# Patient Record
Sex: Female | Born: 1940 | Race: White | Hispanic: No | Marital: Married | Smoking: Never smoker
Health system: Southern US, Community
[De-identification: ages and names within clinical notes are randomized; demographics above are authoritative.]

## PROBLEM LIST (undated history)

## (undated) DIAGNOSIS — E785 Hyperlipidemia, unspecified: Secondary | ICD-10-CM

## (undated) DIAGNOSIS — M8580 Other specified disorders of bone density and structure, unspecified site: Secondary | ICD-10-CM

## (undated) DIAGNOSIS — K759 Inflammatory liver disease, unspecified: Secondary | ICD-10-CM

## (undated) DIAGNOSIS — F0390 Unspecified dementia without behavioral disturbance: Secondary | ICD-10-CM

## (undated) HISTORY — DX: Other specified disorders of bone density and structure, unspecified site: M85.80

## (undated) HISTORY — DX: Hyperlipidemia, unspecified: E78.5

## (undated) HISTORY — DX: Unspecified dementia, unspecified severity, without behavioral disturbance, psychotic disturbance, mood disturbance, and anxiety: F03.90

## (undated) HISTORY — DX: Inflammatory liver disease, unspecified: K75.9

## (undated) HISTORY — PX: VAGINAL HYSTERECTOMY: SUR661

## (undated) HISTORY — PX: UMBILICAL HERNIA REPAIR: SHX196

---

## 1997-08-20 ENCOUNTER — Other Ambulatory Visit: Admission: RE | Admit: 1997-08-20 | Discharge: 1997-08-20 | Payer: Self-pay | Admitting: Obstetrics and Gynecology

## 2005-06-07 ENCOUNTER — Encounter: Admission: RE | Admit: 2005-06-07 | Discharge: 2005-06-07 | Payer: Self-pay | Admitting: Internal Medicine

## 2009-05-21 ENCOUNTER — Emergency Department (HOSPITAL_COMMUNITY): Admission: EM | Admit: 2009-05-21 | Discharge: 2009-05-21 | Payer: Self-pay | Admitting: Emergency Medicine

## 2009-05-25 ENCOUNTER — Encounter: Admission: RE | Admit: 2009-05-25 | Discharge: 2009-05-25 | Payer: Self-pay | Admitting: Internal Medicine

## 2009-11-24 ENCOUNTER — Encounter: Admission: RE | Admit: 2009-11-24 | Discharge: 2009-11-24 | Payer: Self-pay | Admitting: Internal Medicine

## 2010-05-23 LAB — POCT I-STAT, CHEM 8
Calcium, Ion: 1 mmol/L — ABNORMAL LOW (ref 1.12–1.32)
Glucose, Bld: 111 mg/dL — ABNORMAL HIGH (ref 70–99)
HCT: 39 % (ref 36.0–46.0)
TCO2: 23 mmol/L (ref 0–100)

## 2010-05-23 LAB — CBC
HCT: 37.9 % (ref 36.0–46.0)
MCHC: 34.4 g/dL (ref 30.0–36.0)
MCV: 94.9 fL (ref 78.0–100.0)
Platelets: 169 10*3/uL (ref 150–400)
RDW: 13.6 % (ref 11.5–15.5)

## 2010-05-23 LAB — COMPREHENSIVE METABOLIC PANEL
ALT: 31 U/L (ref 0–35)
Chloride: 107 mEq/L (ref 96–112)
GFR calc non Af Amer: >60 mL/min (ref >60)

## 2010-05-23 LAB — URINALYSIS, ROUTINE W REFLEX MICROSCOPIC
Bilirubin Urine: NEGATIVE
Hgb urine dipstick: NEGATIVE
Ketones, ur: 15 mg/dL — AB
Urobilinogen, UA: 0.2 mg/dL (ref 0.0–1.0)

## 2010-05-23 LAB — PROTIME-INR
INR: 0.96 (ref 0.00–1.49)
Prothrombin Time: 12.7 seconds (ref 11.6–15.2)

## 2010-05-23 LAB — TYPE AND SCREEN
ABO/RH(D): O POS
Antibody Screen: NEGATIVE

## 2010-05-23 LAB — APTT: aPTT: 24 seconds (ref 24–37)

## 2010-05-23 LAB — LACTIC ACID, PLASMA: Lactic Acid, Venous: 3 mmol/L — ABNORMAL HIGH (ref 0.5–2.2)

## 2010-05-23 LAB — ABO/RH: ABO/RH(D): O POS

## 2013-06-21 ENCOUNTER — Encounter: Payer: Self-pay | Admitting: *Deleted

## 2013-06-21 DIAGNOSIS — K759 Inflammatory liver disease, unspecified: Secondary | ICD-10-CM | POA: Insufficient documentation

## 2013-06-21 DIAGNOSIS — E785 Hyperlipidemia, unspecified: Secondary | ICD-10-CM

## 2014-03-25 ENCOUNTER — Ambulatory Visit (INDEPENDENT_AMBULATORY_CARE_PROVIDER_SITE_OTHER): Payer: Medicare Other | Admitting: Neurology

## 2014-03-25 ENCOUNTER — Encounter: Payer: Self-pay | Admitting: Neurology

## 2014-03-25 VITALS — BP 132/80 | HR 57 | Resp 16 | Ht 62.5 in | Wt 141.0 lb

## 2014-03-25 DIAGNOSIS — E785 Hyperlipidemia, unspecified: Secondary | ICD-10-CM

## 2014-03-25 DIAGNOSIS — R413 Other amnesia: Secondary | ICD-10-CM

## 2014-03-25 MED ORDER — DONEPEZIL HCL 10 MG PO TABS: ORAL_TABLET | ORAL | Status: DC

## 2014-03-25 NOTE — Patient Instructions (Addendum)
1. Schedule MRI brain without contrast 2. Start Aricept 10mg : Take 1/2 tablet daily for 1 week, then increase to 1 tablet daily 3. Physical exercise and brain stimulation exercises are important for brain health 4. Continue control of cholesterol 5. Follow-up in 3 months, call our office for any problems

## 2014-03-25 NOTE — Progress Notes (Signed)
NEUROLOGY CONSULTATION NOTE  Rachel Garza MRN: [id number] DOB: 11/12/1940  Referring provider: Dr. Renford Dills Primary care provider: Dr. Renford Dills  Reason for consult:  Memory issues  Dear Dr Nehemiah Settle:  Thank you for your kind referral of Rachel Garza for consultation of the above symptoms. Although her history is well known to you, please allow me to reiterate it for the purpose of our medical record. Records and images were personally reviewed where available.  HISTORY OF PRESENT ILLNESS: This is a 74 year old right-handed woman with hyperlipidemia, hepatitis, depression, presenting for evaluation of memory changes that she started to notice over the past year. She could not recall conversations and people would tell her "I just told you that." She frequently misplaces things. She denies getting lost driving, but occasionally has a hard time figuring her way out. She is usually able to make it out. She now has to set out her medications to remember to take them. She has some problems multitasking. She denies leaving the stove on or forgetting recipes. Her husband has always been in charge of bills. She denies any word-finding difficulties. Her father had memory problems.  She had an MMSE at her PCP office last month, normal at 27/30.  She denies any headaches except for a pain in the left parietal region yesterday. She denies any dizziness, diplopia, dysarthria, dysphagia, neck/back pain, focal numbness/tingling/weakness, bowel/bladder dysfunction, anosmia. She has always had a mild tremor in both hands that has been stable for many years. She fell off a ladder 4 years ago with loss of consciousness, no skull fracture.   Laboratory Data: Per PCP notes, TSH, B12, electrolytes unremarkable. Values unavailable for review.   PAST MEDICAL HISTORY: Past Medical History  Diagnosis Date  . Hyperlipidemia   . Osteopenia   . Hepatitis     PAST SURGICAL HISTORY: Past Surgical  History  Procedure Laterality Date  . Umbilical hernia repair    . Vaginal hysterectomy      MEDICATIONS: Current Outpatient Prescriptions on File Prior to Visit  Medication Sig Dispense Refill  . aspirin 81 MG tablet Take 81 mg by mouth daily.    . Multiple Vitamins-Minerals (MULTIVITAMIN PO) Take by mouth.    . Omega-3 Fatty Acids (FISH OIL PO) Take by mouth.    . simvastatin (ZOCOR) 20 MG tablet Take 20 mg by mouth daily.     No current facility-administered medications on file prior to visit.    ALLERGIES: Allergies  Allergen Reactions  . Niacin And Related     FAMILY HISTORY: Family History  Problem Relation Age of Onset  . Coronary artery disease Mother   . Coronary artery disease Father   . Diabetes Mellitus I Sister   . Cancer Sister   . Glaucoma Sister     SOCIAL HISTORY: History   Social History  . Marital Status: Single    Spouse Name: N/A    Number of Children: N/A  . Years of Education: N/A   Occupational History  . Not on file.   Social History Main Topics  . Smoking status: Never Smoker   . Smokeless tobacco: Not on file  . Alcohol Use: No  . Drug Use: No  . Sexual Activity: Not on file   Other Topics Concern  . Not on file   Social History Narrative    REVIEW OF SYSTEMS: Constitutional: No fevers, chills, or sweats, no generalized fatigue, change in appetite Eyes: No visual changes, double vision, eye  pain Ear, nose and throat: No hearing loss, ear pain, nasal congestion, sore throat Cardiovascular: No chest pain, palpitations Respiratory:  No shortness of breath at rest or with exertion, wheezes GastrointestinaI: No nausea, vomiting, diarrhea, abdominal pain, fecal incontinence Genitourinary:  No dysuria, urinary retention or frequency Musculoskeletal:  No neck pain, back pain Integumentary: No rash, pruritus, skin lesions Neurological: as above Psychiatric: No depression, insomnia, anxiety Endocrine: No palpitations, fatigue,  diaphoresis, mood swings, change in appetite, change in weight, increased thirst Hematologic/Lymphatic:  No anemia, purpura, petechiae. Allergic/Immunologic: no itchy/runny eyes, nasal congestion, recent allergic reactions, rashes  PHYSICAL EXAM: Filed Vitals:   03/25/14 1248  BP: 132/80  Pulse: 57  Resp: 16   General: No acute distress Head:  Normocephalic/atraumatic Eyes: Fundoscopic exam shows bilateral sharp discs, no vessel changes, exudates, or hemorrhages Neck: supple, no paraspinal tenderness, full range of motion Back: No paraspinal tenderness Heart: regular rate and rhythm Lungs: Clear to auscultation bilaterally. Vascular: No carotid bruits. Skin/Extremities: No rash, no edema Neurological Exam: Mental status: alert and oriented to person, place, and time, no dysarthria or aphasia, Fund of knowledge is appropriate.  Recent and remote memory are intact.  Attention and concentration are normal.    Able to name objects and repeat phrases. Montreal Cognitive Assessment  03/25/2014  Visuospatial/ Executive (0/5) 5  Naming (0/3) 3  Attention: Read list of digits (0/2) 2  Attention: Read list of letters (0/1) 1  Attention: Serial 7 subtraction starting at 100 (0/3) 2  Language: Repeat phrase (0/2) 2  Language : Fluency (0/1) 1  Abstraction (0/2) 2  Delayed Recall (0/5) 2  Orientation (0/6) 5  Total 25   Cranial nerves: CN I: not tested CN II: pupils equal, round and reactive to light, visual fields intact, fundi unremarkable. CN III, IV, VI:  full range of motion, no nystagmus, no ptosis CN V: facial sensation intact CN VII: upper and lower face symmetric CN VIII: hearing intact to finger rub CN IX, X: gag intact, uvula midline CN XI: sternocleidomastoid and trapezius muscles intact CN XII: tongue midline Bulk & Tone: normal, no fasciculations. Motor: 5/5 throughout with no pronator drift. Sensation: decreased vibration sense to both ankles, otherwise intact to light  touch, cold, pin,and joint position sense.  No extinction to double simultaneous stimulation.  Romberg test negative Deep Tendon Reflexes: +2 throughout, no ankle clonus Plantar responses: downgoing bilaterally Cerebellar: no incoordination on finger to nose, heel to shin. No dysdiadochokinesia Gait: narrow-based and steady, able to tandem walk adequately. Tremor: none noted today  IMPRESSION: This is a 74 year old right-handed woman with a hyperlipidemia, depression, presenting for memory changes over the past year. Her MOCA score is 25/30 (normal >26/30), indicating mild cognitive impairment. We discussed different causes of memory changes. TSH and B12 reported as normal. MRI brain without contrast will be ordered to assess for underlying structural abnormality. We discussed effects of mood on memory, she denies any depression symptoms and will continue to monitor this. She may benefit from starting cholinesterase inhibitors such as Aricept, side effects and expectations from the medication were discussed. We discussed the importance of control of vascular risk factors, physical exercise, and brain stimulation exercises for brain health. She will follow-up in 3 months and knows to call our office for any problems.   Thank you for allowing me to participate in the care of this patient. Please do not hesitate to call for any questions or concerns.   Patrcia Dolly, M.D.  CC: Dr.  Polite

## 2014-03-31 ENCOUNTER — Encounter: Payer: Self-pay | Admitting: Family Medicine

## 2014-04-03 ENCOUNTER — Encounter (HOSPITAL_COMMUNITY): Payer: Self-pay | Admitting: Emergency Medicine

## 2014-04-03 ENCOUNTER — Emergency Department (HOSPITAL_COMMUNITY): Payer: Medicare Other

## 2014-04-03 ENCOUNTER — Ambulatory Visit (HOSPITAL_COMMUNITY): Admission: RE | Admit: 2014-04-03 | Payer: Medicare Other | Source: Ambulatory Visit

## 2014-04-03 ENCOUNTER — Emergency Department (HOSPITAL_COMMUNITY)
Admission: EM | Admit: 2014-04-03 | Discharge: 2014-04-03 | Disposition: A | Discharge status: Home / Self Care | Payer: Medicare Other | Attending: Emergency Medicine | Admitting: Emergency Medicine

## 2014-04-03 ENCOUNTER — Ambulatory Visit (HOSPITAL_COMMUNITY)
Admission: RE | Admit: 2014-04-03 | Discharge: 2014-04-03 | Disposition: A | Discharge status: Home / Self Care | Payer: Medicare Other | Source: Ambulatory Visit | Attending: Neurology | Admitting: Neurology

## 2014-04-03 DIAGNOSIS — E785 Hyperlipidemia, unspecified: Secondary | ICD-10-CM | POA: Diagnosis not present

## 2014-04-03 DIAGNOSIS — Z792 Long term (current) use of antibiotics: Secondary | ICD-10-CM | POA: Diagnosis not present

## 2014-04-03 DIAGNOSIS — I159 Secondary hypertension, unspecified: Secondary | ICD-10-CM | POA: Insufficient documentation

## 2014-04-03 DIAGNOSIS — G4489 Other headache syndrome: Secondary | ICD-10-CM | POA: Insufficient documentation

## 2014-04-03 DIAGNOSIS — Z7982 Long term (current) use of aspirin: Secondary | ICD-10-CM | POA: Diagnosis not present

## 2014-04-03 DIAGNOSIS — J0100 Acute maxillary sinusitis, unspecified: Secondary | ICD-10-CM | POA: Insufficient documentation

## 2014-04-03 DIAGNOSIS — Z7952 Long term (current) use of systemic steroids: Secondary | ICD-10-CM | POA: Insufficient documentation

## 2014-04-03 DIAGNOSIS — Z8619 Personal history of other infectious and parasitic diseases: Secondary | ICD-10-CM | POA: Insufficient documentation

## 2014-04-03 DIAGNOSIS — Z79899 Other long term (current) drug therapy: Secondary | ICD-10-CM | POA: Diagnosis not present

## 2014-04-03 DIAGNOSIS — R51 Headache: Secondary | ICD-10-CM | POA: Diagnosis present

## 2014-04-03 LAB — CBC WITH DIFFERENTIAL/PLATELET
BASOS ABS: 0 10*3/uL (ref 0.0–0.1)
Basophils Relative: 0 % (ref 0–1)
EOS ABS: 0 10*3/uL (ref 0.0–0.7)
Eosinophils Relative: 0 % (ref 0–5)
HCT: 39.9 % (ref 36.0–46.0)
HEMOGLOBIN: 13.5 g/dL (ref 12.0–15.0)
LYMPHS ABS: 0.9 10*3/uL (ref 0.7–4.0)
Lymphocytes Relative: 9 % — ABNORMAL LOW (ref 12–46)
MCH: 31.7 pg (ref 26.0–34.0)
MCHC: 33.8 g/dL (ref 30.0–36.0)
MCV: 93.7 fL (ref 78.0–100.0)
MONOS PCT: 4 % (ref 3–12)
Monocytes Absolute: 0.4 10*3/uL (ref 0.1–1.0)
NEUTROS PCT: 87 % — AB (ref 43–77)
Neutro Abs: 7.7 10*3/uL (ref 1.7–7.7)
Platelets: 179 10*3/uL (ref 150–400)
RBC: 4.26 MIL/uL (ref 3.87–5.11)
RDW: 13 % (ref 11.5–15.5)
WBC: 9 10*3/uL (ref 4.0–10.5)

## 2014-04-03 LAB — BASIC METABOLIC PANEL
ANION GAP: 4 — AB (ref 5–15)
BUN: 14 mg/dL (ref 6–23)
CALCIUM: 10 mg/dL (ref 8.4–10.5)
CO2: 30 mmol/L (ref 19–32)
CREATININE: 0.77 mg/dL (ref 0.50–1.10)
Chloride: 103 mmol/L (ref 96–112)
GFR calc Af Amer: >90 mL/min (ref >90)
GFR, EST NON AFRICAN AMERICAN: 82 mL/min — AB (ref >90)
Glucose, Bld: 127 mg/dL — ABNORMAL HIGH (ref 70–99)
Potassium: 3.7 mmol/L (ref 3.5–5.1)
SODIUM: 137 mmol/L (ref 135–145)

## 2014-04-03 MED ORDER — MOMETASONE FUROATE 50 MCG/ACT NA SUSP: 2.0000 | Freq: Every day | NASAL | Status: DC

## 2014-04-03 MED ORDER — OXYCODONE-ACETAMINOPHEN 5-325 MG PO TABS
1.0000 | ORAL_TABLET | Freq: Once | ORAL | Status: AC
  Administered 2014-04-03: 1 via ORAL
  Filled 2014-04-03: qty 1

## 2014-04-03 MED ORDER — AMOXICILLIN 500 MG PO CAPS: 500.0000 mg | ORAL_CAPSULE | Freq: Three times a day (TID) | ORAL | Status: DC

## 2014-04-03 MED ORDER — ONDANSETRON 8 MG PO TBDP
8.0000 mg | ORAL_TABLET | Freq: Once | ORAL | Status: AC
  Administered 2014-04-03: 8 mg via ORAL
  Filled 2014-04-03: qty 1

## 2014-04-03 NOTE — Discharge Instructions (Signed)
General Headache Without Cause °A general headache is pain or discomfort felt around the head or neck area. The cause may not be found.  °HOME CARE  °· Keep all doctor visits. °· Only take medicines as told by your doctor. °· Lie down in a dark, quiet room when you have a headache. °· Keep a journal to find out if certain things bring on headaches. For example, write down: °· What you eat and drink. °· How much sleep you get. °· Any change to your diet or medicines. °· Relax by getting a massage or doing other relaxing activities. °· Put ice or heat packs on the head and neck area as told by your doctor. °· Lessen stress. °· Sit up straight. Do not tighten (tense) your muscles. °· Quit smoking if you smoke. °· Lessen how much alcohol you drink. °· Lessen how much caffeine you drink, or stop drinking caffeine. °· Eat and sleep on a regular schedule. °· Get 7 to 9 hours of sleep, or as told by your doctor. °· Keep lights dim if bright lights bother you or make your headaches worse. °GET HELP RIGHT AWAY IF:  °· Your headache becomes really bad. °· You have a fever. °· You have a stiff neck. °· You have trouble seeing. °· Your muscles are weak, or you lose muscle control. °· You lose your balance or have trouble walking. °· You feel like you will pass out (faint), or you pass out. °· You have really bad symptoms that are different than your first symptoms. °· You have problems with the medicines given to you by your doctor. °· Your medicines do not work. °· Your headache feels different than the other headaches. °· You feel sick to your stomach (nauseous) or throw up (vomit). °MAKE SURE YOU:  °· Understand these instructions. °· Will watch your condition. °· Will get help right away if you are not doing well or get worse. °Document Released: 11/23/2007 Document Revised: 05/08/2011 Document Reviewed: 02/03/2011 °ExitCare® Patient Information ©2015 [employer]. This information is not intended to replace advice given to  you by your health care provider. Make sure you discuss any questions you have with your health care provider. ° °Hypertension °Hypertension is another name for high blood pressure. High blood pressure forces your heart to work harder to pump blood. A blood pressure reading has two numbers, which includes a higher number over a lower number (example: 110/72). °HOME CARE  °· Have your blood pressure rechecked by your doctor. °· Only take medicine as told by your doctor. Follow the directions carefully. The medicine does not work as well if you skip doses. Skipping doses also puts you at risk for problems. °· Do not smoke. °· Monitor your blood pressure at home as told by your doctor. °GET HELP IF: °· You think you are having a reaction to the medicine you are taking. °· You have repeat headaches or feel dizzy. °· You have puffiness (swelling) in your ankles. °· You have trouble with your vision. °GET HELP RIGHT AWAY IF:  °· You get a very bad headache and are confused. °· You feel weak, numb, or faint. °· You get chest or belly (abdominal) pain. °· You throw up (vomit). °· You cannot breathe very well. °MAKE SURE YOU:  °· Understand these instructions. °· Will watch your condition. °· Will get help right away if you are not doing well or get worse. °Document Released: 08/02/2007 Document Revised: 02/18/2013 Document Reviewed: 12/06/2012 °ExitCare®   Patient Information 2015 [place] [place]. This information is not intended to replace advice given to you by your health care provider. Make sure you discuss any questions you have with your health care provider.  Sinusitis Sinusitis is redness, soreness, and puffiness (inflammation) of the air pockets in the bones of your face (sinuses). The redness, soreness, and puffiness can cause air and mucus to get trapped in your sinuses. This can allow germs to grow and cause an infection.  HOME CARE   Drink enough fluids to keep your pee (urine) clear or pale yellow.  Use a  humidifier in your home.  Run a hot shower to create steam in the bathroom. Sit in the bathroom with the door closed. Breathe in the steam 3-4 times a day.  Put a warm, moist washcloth on your face 3-4 times a day, or as told by your doctor.  Use salt water sprays (saline sprays) to wet the thick fluid in your nose. This can help the sinuses drain.  Only take medicine as told by your doctor. GET HELP RIGHT AWAY IF:   Your pain gets worse.  You have very bad headaches.  You are sick to your stomach (nauseous).  You throw up (vomit).  You are very sleepy (drowsy) all the time.  Your face is puffy (swollen).  Your vision changes.  You have a stiff neck.  You have trouble breathing. MAKE SURE YOU:   Understand these instructions.  Will watch your condition.  Will get help right away if you are not doing well or get worse. Document Released: 08/02/2007 Document Revised: 11/08/2011 Document Reviewed: 09/19/2011 [facility] Patient Information 2015 [place], [place]. This information is not intended to replace advice given to you by your health care provider. Make sure you discuss any questions you have with your health care provider.

## 2014-04-03 NOTE — ED Notes (Signed)
Pt c/o high blood pressure and "pounding" headache on right side. Pt states her BP was 170/69 at 1230 so she called PCP, who sent her to ED. BP 182/71 in triage.

## 2014-04-03 NOTE — ED Notes (Signed)
Initial Contact - pt A+Ox4, resting on stretcher, family at bedside, pt reports c/o headache 5/10 and HTN at home today.  Onset around 1300.  Pt denies cp/palpitations or SOB.  Reports mild nausea at onset of symp, but denies at this time.  Pt reports has a previously scheduled MRI appt at 5pm today "for memory issues" and would like to get there.  Neuros grossly intact.  Tongue midline, speech clear, no facial asymmetry, MAEI, +csm/+pulses.  Skin PWD.  Speaking full/clear sentences, rr even/un-lab.  NAD.

## 2014-04-03 NOTE — ED Provider Notes (Signed)
CSN: [id number]     Arrival date & time 04/03/14  1348 History   First MD Initiated Contact with Patient 04/03/14 1515     Chief Complaint  Patient presents with  . Hypertension  . Headache     (Consider location/radiation/quality/duration/timing/severity/associated sxs/prior Treatment) HPI   Rachel Garza is a 74 y.o. female who presents for evaluation of left-sided headache associated with nausea, dizziness, and elevated blood pressure.  Her husband checked her blood pressure it was 160/95.  She is scheduled to have an MRI of her brain later today to be evaluated for "memory loss."  This was scheduled by her PCP, 2 weeks ago.  She is taking her usual medications.  There are no other known modifying factors.   Past Medical History  Diagnosis Date  . Hyperlipidemia   . Osteopenia   . Hepatitis    Past Surgical History  Procedure Laterality Date  . Umbilical hernia repair    . Vaginal hysterectomy     Family History  Problem Relation Age of Onset  . Coronary artery disease Mother   . Coronary artery disease Father   . Diabetes Mellitus I Sister   . Cancer Sister   . Glaucoma Sister    History  Substance Use Topics  . Smoking status: Never Smoker   . Smokeless tobacco: Not on file  . Alcohol Use: No   OB History    No data available     Review of Systems  All other systems reviewed and are negative.     Allergies  Niacin and related  Home Medications   Prior to Admission medications   Medication Sig Start Date End Date Taking? Authorizing Provider  aspirin 81 MG tablet Take 81 mg by mouth daily.   Yes Historical Provider, MD  donepezil (ARICEPT) 10 MG tablet Take 1/2 tablet daily for 1 week, then increase to 1 tablet daily Patient taking differently: Take 10 mg by mouth daily.  03/25/14  Yes Van Clines, MD  Multiple Vitamins-Minerals (MULTIVITAMIN PO) Take 1 tablet by mouth daily.    Yes Historical Provider, MD  Omega-3 Fatty Acids (FISH OIL PO) Take 1  tablet by mouth daily.    Yes Historical Provider, MD  simvastatin (ZOCOR) 20 MG tablet Take 20 mg by mouth daily.   Yes Historical Provider, MD  amoxicillin (AMOXIL) 500 MG capsule Take 1 capsule (500 mg total) by mouth 3 (three) times daily. 04/03/14   Flint Melter, MD  mometasone (NASONEX) 50 MCG/ACT nasal spray Place 2 sprays into the nose daily. 04/03/14   Flint Melter, MD   BP 149/71 mmHg  Pulse 69  Temp(Src) 97.6 F (36.4 C) (Oral)  Resp 18  SpO2 97% Physical Exam  Constitutional: She is oriented to person, place, and time. She appears well-developed and well-nourished. No distress.  HENT:  Head: Normocephalic and atraumatic.  Right Ear: External ear normal.  Left Ear: External ear normal.  Eyes: Conjunctivae and EOM are normal. Pupils are equal, round, and reactive to light.  Neck: Normal range of motion and phonation normal. Neck supple.  No meningismus  Cardiovascular: Normal rate, regular rhythm and normal heart sounds.   Pulmonary/Chest: Effort normal and breath sounds normal. She exhibits no bony tenderness.  Abdominal: Soft. There is no tenderness.  Musculoskeletal: Normal range of motion.  Neurological: She is alert and oriented to person, place, and time. No cranial nerve deficit or sensory deficit. She exhibits normal muscle tone. Coordination normal.  No  dysarthria and aphasia or nystagmus.  No truncal ataxia.  Skin: Skin is warm, dry and intact.  Psychiatric: She has a normal mood and affect. Her behavior is normal. Judgment and thought content normal.  Nursing note and vitals reviewed.   ED Course  Procedures (including critical care time) Labs Review Labs Reviewed  BASIC METABOLIC PANEL - Abnormal; Notable for the following:    Glucose, Bld 127 (*)    GFR calc non Af Amer 82 (*)    Anion gap 4 (*)    All other components within normal limits  CBC WITH DIFFERENTIAL/PLATELET - Abnormal; Notable for the following:    Neutrophils Relative % 87 (*)     Lymphocytes Relative 9 (*)    All other components within normal limits    Imaging Review Ct Head Wo Contrast  04/03/2014   CLINICAL DATA:  Acute onset headache with hypertension  EXAM: CT HEAD WITHOUT CONTRAST  TECHNIQUE: Contiguous axial images were obtained from the base of the skull through the vertex without intravenous contrast.  COMPARISON:  May 21, 2009 and June 07, 2005  FINDINGS: There is age related volume loss. There is no demonstrable mass effect, hemorrhage, extra-axial fluid collection, or midline shift. There is slight small vessel disease in the centra semiovale bilaterally. No new gray-white compartment lesion is identified. There is no acute appearing infarct. A small focus of calcification just to the left of the anterior falx measures 9 by 5 mm. This focus is stable. It may represent a tiny calcified meningioma of no clinical significance. There is no edema or mass effect in this area. The bony calvarium appears intact. The mastoid air cells are clear. There is opacification throughout the right maxillary antrum.  IMPRESSION: Mild atrophy with mild periventricular small vessel disease. No intracranial mass effect, hemorrhage, or acute appearing infarct. Extensive right maxillary sinus disease.   Electronically Signed   By: Bretta BangWilliam  Woodruff M.D.   On: 04/03/2014 16:21     EKG Interpretation None      MDM   Final diagnoses:  Secondary hypertension, unspecified  Other headache syndrome  Acute maxillary sinusitis, recurrence not specified    Headache, likely related to maxillary sinus disease.  No evidence for hypertensive urgency, intracranial bleeding, or severe spectral infection.   Nursing Notes Reviewed/ Care Coordinated Applicable Imaging Reviewed Interpretation of Laboratory Data incorporated into ED treatment  The patient appears reasonably screened and/or stabilized for discharge and I doubt any other medical condition or other [facility] requiring further  screening, evaluation, or treatment in the ED at this time prior to discharge.  Plan: Home Medications- Nasonex, Amoxicillin; Home Treatments- rest, fluids; return here if the recommended treatment, does not improve the symptoms; Recommended follow up- PCP prn   Flint MelterElliott L Brenda Cowher, MD 04/03/14 [phone]

## 2014-06-24 ENCOUNTER — Encounter: Payer: Self-pay | Admitting: Neurology

## 2014-06-24 ENCOUNTER — Ambulatory Visit (INDEPENDENT_AMBULATORY_CARE_PROVIDER_SITE_OTHER): Payer: Medicare Other | Admitting: Neurology

## 2014-06-24 VITALS — BP 132/74 | HR 64 | Resp 16 | Ht 62.5 in | Wt 143.0 lb

## 2014-06-24 DIAGNOSIS — G3184 Mild cognitive impairment, so stated: Secondary | ICD-10-CM

## 2014-06-24 DIAGNOSIS — F329 Major depressive disorder, single episode, unspecified: Secondary | ICD-10-CM | POA: Diagnosis not present

## 2014-06-24 DIAGNOSIS — R413 Other amnesia: Secondary | ICD-10-CM

## 2014-06-24 DIAGNOSIS — F03918 Unspecified dementia, unspecified severity, with other behavioral disturbance: Secondary | ICD-10-CM | POA: Insufficient documentation

## 2014-06-24 DIAGNOSIS — F32A Depression, unspecified: Secondary | ICD-10-CM

## 2014-06-24 DIAGNOSIS — F0391 Unspecified dementia with behavioral disturbance: Secondary | ICD-10-CM | POA: Insufficient documentation

## 2014-06-24 MED ORDER — CITALOPRAM HYDROBROMIDE 10 MG PO TABS: 10.0000 mg | ORAL_TABLET | Freq: Every day | ORAL | Status: DC

## 2014-06-24 MED ORDER — DONEPEZIL HCL 10 MG PO TABS: ORAL_TABLET | ORAL | Status: DC

## 2014-06-24 NOTE — Patient Instructions (Signed)
1. Continue Aricept 10mg  daily 2. Start Celexa 10mg  daily 3. Continue to monitor driving, if family expresses concern, recommend DMV evaluation 4. Follow-up in 3 months

## 2014-06-24 NOTE — Progress Notes (Signed)
NEUROLOGY FOLLOW UP OFFICE NOTE  Rachel Garza [id number]  HISTORY OF PRESENT ILLNESS: I had the pleasure of seeing Rachel Garza in follow-up in the neurology clinic on 06/24/2014.  The patient was last seen 3 months ago for worsening memory. She is accompanied by her 2 daughters and granddaughter who help supplement the history today.  Records and images were personally reviewed where available.  I personally reviewed MRI brain without contrast which showed mild diffuse atrophy, mild chronic microvascular disease. Her MOCA score in January 2016 was 25/30, indicating mild cognitive impairment. She started Aricept  daily, which she is tolerating without side effects. Her family reports that she asks the same questions repeatedly, or forgets about appointments. She got turned around driving familiar roads in March. In February, her daughter called the police after she could not find her mother 4 hours after talking to her about going to the [place] which was an hour away. The police went looking for her, and as they were about to do a Silver Alert, the patient called her daughter reporting she did arrive to the [place] but spent 3 hours looking for a parking spot. Rachel Garza denies getting lost that time. She denies any headaches, dizziness, diplopia, focal numbness/tingling/weakness. No falls. Her daughters feels she had depression, she is more frustrated and is more emotional. No paranoia.   HPI: This is a 74 yo RH woman with hyperlipidemia, hepatitis, depression, who presented with memory changes that she started to notice in 2015. She could not recall conversations and people would tell her "I just told you that." She frequently misplaces things. She denies getting lost driving, but occasionally has a hard time figuring her way out. She is usually able to make it out. She now has to set out her medications to remember to take them. She has some problems multitasking. She denies leaving the stove on or  forgetting recipes. Her husband has always been in charge of bills. She denies any word-finding difficulties. Her father had memory problems. She had an MMSE at her PCP office last month, normal at 27/30.  PAST MEDICAL HISTORY: Past Medical History  Diagnosis Date  . Hyperlipidemia   . Osteopenia   . Hepatitis     MEDICATIONS: Current Outpatient Prescriptions on File Prior to Visit  Medication Sig Dispense Refill  . aspirin 81 MG tablet Take 81 mg by mouth daily.    Marland Kitchen donepezil (ARICEPT) 10 MG tablet Take 1/2 tablet daily for 1 week, then increase to 1 tablet daily (Patient taking differently: Take 10 mg by mouth at bedtime. ) 30 tablet 6  . mometasone (NASONEX) 50 MCG/ACT nasal spray Place 2 sprays into the nose daily. 17 g 12  . Multiple Vitamins-Minerals (MULTIVITAMIN PO) Take 1 tablet by mouth daily.     . Omega-3 Fatty Acids (FISH OIL PO) Take 1 tablet by mouth daily.     . simvastatin (ZOCOR) 20 MG tablet Take 20 mg by mouth daily.     No current facility-administered medications on file prior to visit.    ALLERGIES: Allergies  Allergen Reactions  . Niacin And Related     FAMILY HISTORY: Family History  Problem Relation Age of Onset  . Coronary artery disease Mother   . Coronary artery disease Father   . Diabetes Mellitus I Sister   . Cancer Sister   . Glaucoma Sister     SOCIAL HISTORY: History   Social History  . Marital Status: Married    Spouse  Name: N/A  . Number of Children: N/A  . Years of Education: N/A   Occupational History  . Not on file.   Social History Main Topics  . Smoking status: Never Smoker   . Smokeless tobacco: Not on file  . Alcohol Use: No  . Drug Use: No  . Sexual Activity: Not on file   Other Topics Concern  . Not on file   Social History Narrative    REVIEW OF SYSTEMS: Constitutional: No fevers, chills, or sweats, no generalized fatigue, change in appetite Eyes: No visual changes, double vision, eye pain Ear, nose and  throat: No hearing loss, ear pain, nasal congestion, sore throat Cardiovascular: No chest pain, palpitations Respiratory:  No shortness of breath at rest or with exertion, wheezes GastrointestinaI: No nausea, vomiting, diarrhea, abdominal pain, fecal incontinence Genitourinary:  No dysuria, urinary retention or frequency Musculoskeletal:  No neck pain, back pain Integumentary: No rash, pruritus, skin lesions Neurological: as above Psychiatric: No depression, insomnia, anxiety Endocrine: No palpitations, fatigue, diaphoresis, mood swings, change in appetite, change in weight, increased thirst Hematologic/Lymphatic:  No anemia, purpura, petechiae. Allergic/Immunologic: no itchy/runny eyes, nasal congestion, recent allergic reactions, rashes  PHYSICAL EXAM: Filed Vitals:   06/24/14 1057  BP: 132/74  Pulse: 64  Resp: 16   General: No acute distress Head:  Normocephalic/atraumatic Neck: supple, no paraspinal tenderness, full range of motion Heart:  Regular rate and rhythm Lungs:  Clear to auscultation bilaterally Back: No paraspinal tenderness Skin/Extremities: No rash, no edema Neurological Exam: alert and oriented to person, place, and time. No aphasia or dysarthria. Fund of knowledge is appropriate.  Remote memory intact.  Attention and concentration are normal.    Able to name objects and repeat phrases.  MMSE - Mini Mental State Exam 06/24/2014  Orientation to time 3  Orientation to Place 5  Registration 3  Attention/ Calculation 4  Recall 1  Language- name 2 objects 2  Language- repeat 1  Language- follow 3 step command 3  Language- read & follow direction 1  Write a sentence 1  Copy design 1  Total score 25   Cranial nerves: Pupils equal, round, reactive to light.  Fundoscopic exam unremarkable, no papilledema. Extraocular movements intact with no nystagmus. Visual fields full. Facial sensation intact. No facial asymmetry. Tongue, uvula, palate midline.  Motor: Bulk and  tone normal, muscle strength 5/5 throughout with no pronator drift.  Sensation to light touch intact.  No extinction to double simultaneous stimulation.  Deep tendon reflexes 2+ throughout, toes downgoing.  Finger to nose testing intact.  Gait narrow-based and steady, able to tandem walk adequately.  Romberg negative.  IMPRESSION: This is a 74 yo RH woman with a hyperlipidemia, depression, who presented with memory changes since 2015. Her MMSE today is 25/30, we discussed symptoms suggestive of mild cognitive impairment. MRI brain unremarkable. We discussed expectations from Aricept, continue Aricept  daily. Her family expressed concern for mood affecting her memory, she may benefit from starting a low dose SSRI. She was given a prescription for Celexa  daily, side effects were discussed. We also discussed driving, since family is concerned, would recommend doing a DMV driving evaluation. We again discussed the importance of control of vascular risk factors, physical exercise, and brain stimulation exercises for brain health. She will follow-up in 3 months and knows to call our office for any problems.   Thank you for allowing me to participate in her care.  Please do not hesitate to call for any questions  or concerns.  The duration of this appointment visit was 15 minutes of face-to-face time with the patient.  Greater than 50% of this time was spent in counseling, explanation of diagnosis, planning of further management, and coordination of care.   Patrcia DollyKaren Maxcine Strong, M.D.   CC: Dr. Nehemiah SettlePolite

## 2014-09-23 ENCOUNTER — Ambulatory Visit: Payer: BC Managed Care – PPO | Admitting: Neurology

## 2014-09-24 ENCOUNTER — Encounter: Payer: Self-pay | Admitting: Neurology

## 2014-09-24 ENCOUNTER — Ambulatory Visit (INDEPENDENT_AMBULATORY_CARE_PROVIDER_SITE_OTHER): Payer: Medicare Other | Admitting: Neurology

## 2014-09-24 VITALS — BP 112/70 | HR 53 | Resp 16 | Ht 62.5 in | Wt 132.0 lb

## 2014-09-24 DIAGNOSIS — F32A Depression, unspecified: Secondary | ICD-10-CM

## 2014-09-24 DIAGNOSIS — F329 Major depressive disorder, single episode, unspecified: Secondary | ICD-10-CM | POA: Diagnosis not present

## 2014-09-24 DIAGNOSIS — R413 Other amnesia: Secondary | ICD-10-CM | POA: Diagnosis not present

## 2014-09-24 DIAGNOSIS — G3184 Mild cognitive impairment, so stated: Secondary | ICD-10-CM

## 2014-09-24 MED ORDER — CITALOPRAM HYDROBROMIDE 10 MG PO TABS: 10.0000 mg | ORAL_TABLET | Freq: Every day | ORAL | Status: DC

## 2014-09-24 MED ORDER — DONEPEZIL HCL 10 MG PO TABS: ORAL_TABLET | ORAL | Status: DC

## 2014-09-24 NOTE — Progress Notes (Signed)
NEUROLOGY FOLLOW UP OFFICE NOTE  LACARA DUNSWORTH [id number]  HISTORY OF PRESENT ILLNESS: I had the pleasure of seeing Litzy Dicker in follow-up in the neurology clinic on 09/24/2014.  The patient was last seen 3 months ago for worsening memory. She is again accompanied by her daughter and granddaughter who help supplement the history today.MMSE in April 2016 was 25/30. On her last visit, her daughter felt her mother was more depressed, more emotional and easily frustrated. She was started on Celexa  daily, which she is tolerating without side effects. The patient reports mood is good, her daughter feels things are stable. Her memory is unchanged. She continues to take Aricept  daily without side effects. She continues to drive without getting lost or turned around in familiar streets. She denies any headaches, dizziness, diplopia, focal numbness/tingling/weakness. No falls.   HPI: This is a 74 yo RH woman with hyperlipidemia, hepatitis, depression, who presented with memory changes that she started to notice in 2015. She could not recall conversations and people would tell her "I just told you that." She frequently misplaces things. She denies getting lost driving, but occasionally has a hard time figuring her way out. She is usually able to make it out. She now has to set out her medications to remember to take them. She has some problems multitasking. She denies leaving the stove on or forgetting recipes. Her husband has always been in charge of bills. She denies any word-finding difficulties. Her father had memory problems. She had an MMSE at her PCP office last month, normal at 27/30.  Diagnostic Data: MRI brain without contrast showed mild diffuse atrophy, mild chronic microvascular disease, no acute changes.   PAST MEDICAL HISTORY: Past Medical History  Diagnosis Date  . Hyperlipidemia   . Osteopenia   . Hepatitis     MEDICATIONS: Current Outpatient Prescriptions on File  Prior to Visit  Medication Sig Dispense Refill  . aspirin 81 MG tablet Take 81 mg by mouth daily.    . citalopram (CELEXA) 10 MG tablet Take 1 tablet (10 mg total) by mouth daily. 30 tablet 4  . donepezil (ARICEPT) 10 MG tablet Take 1 tablet daily 30 tablet 6  . mometasone (NASONEX) 50 MCG/ACT nasal spray Place 2 sprays into the nose daily. 17 g 12  . Multiple Vitamins-Minerals (MULTIVITAMIN PO) Take 1 tablet by mouth daily.     . Omega-3 Fatty Acids (FISH OIL PO) Take 1 tablet by mouth daily.     . simvastatin (ZOCOR) 20 MG tablet Take 20 mg by mouth daily.     No current facility-administered medications on file prior to visit.    ALLERGIES: Allergies  Allergen Reactions  . Niacin And Related     FAMILY HISTORY: Family History  Problem Relation Age of Onset  . Coronary artery disease Mother   . Coronary artery disease Father   . Diabetes Mellitus I Sister   . Cancer Sister   . Glaucoma Sister     SOCIAL HISTORY: History   Social History  . Marital Status: Married    Spouse Name: N/A  . Number of Children: N/A  . Years of Education: N/A   Occupational History  . Not on file.   Social History Main Topics  . Smoking status: Never Smoker   . Smokeless tobacco: Not on file  . Alcohol Use: No  . Drug Use: No  . Sexual Activity: Not on file   Other Topics Concern  . Not on file  Social History Narrative    REVIEW OF SYSTEMS: Constitutional: No fevers, chills, or sweats, no generalized fatigue, change in appetite Eyes: No visual changes, double vision, eye pain Ear, nose and throat: No hearing loss, ear pain, nasal congestion, sore throat Cardiovascular: No chest pain, palpitations Respiratory:  No shortness of breath at rest or with exertion, wheezes GastrointestinaI: No nausea, vomiting, diarrhea, abdominal pain, fecal incontinence Genitourinary:  No dysuria, urinary retention or frequency Musculoskeletal:  No neck pain, back pain Integumentary: No rash,  pruritus, skin lesions Neurological: as above Psychiatric: No depression, insomnia, anxiety Endocrine: No palpitations, fatigue, diaphoresis, mood swings, change in appetite, change in weight, increased thirst Hematologic/Lymphatic:  No anemia, purpura, petechiae. Allergic/Immunologic: no itchy/runny eyes, nasal congestion, recent allergic reactions, rashes  PHYSICAL EXAM: Filed Vitals:   09/24/14 1055  BP: 112/70  Pulse: 53  Resp: 16   General: No acute distress Head:  Normocephalic/atraumatic Neck: supple, no paraspinal tenderness, full range of motion Heart:  Regular rate and rhythm Lungs:  Clear to auscultation bilaterally Back: No paraspinal tenderness Skin/Extremities: No rash, no edema Neurological Exam: alert and oriented to person, place, and time. No aphasia or dysarthria. Fund of knowledge is appropriate.  Remote memory intact. 1/3 delayed recall. Attention and concentration are normal.    Able to name objects and repeat phrases. Cranial nerves: Pupils equal, round, reactive to light.  Fundoscopic exam unremarkable, no papilledema. Extraocular movements intact with no nystagmus. Visual fields full. Facial sensation intact. No facial asymmetry. Tongue, uvula, palate midline.  Motor: Bulk and tone normal, muscle strength 5/5 throughout with no pronator drift.  Sensation to light touch intact.  No extinction to double simultaneous stimulation.  Deep tendon reflexes 2+ throughout, toes downgoing.  Finger to nose testing intact.  Gait narrow-based and steady, able to tandem walk adequately.  Romberg negative.  IMPRESSION: This is a 74 yo RH woman with a hyperlipidemia, depression, who presented with memory changes since 2015. Her MMSE in April 2016 was 25/30, suggestive of mild cognitive impairment. MRI brain unremarkable. Continue Aricept 10mg  daily and Celexa 10mg  daily. We discussed driving, and recommendations for a driving assessment if family has concerns. We again discussed the  importance of control of vascular risk factors, physical exercise, and brain stimulation exercises for brain health. She will follow-up in 1 year and knows to call our office for any problems.   Thank you for allowing me to participate in her care.  Please do not hesitate to call for any questions or concerns.  The duration of this appointment visit was 14 minutes of face-to-face time with the patient.  Greater than 50% of this time was spent in counseling, explanation of diagnosis, planning of further management, and coordination of care.   Patrcia Dolly, M.D.   CC: Dr. Nehemiah Settle

## 2014-09-24 NOTE — Patient Instructions (Signed)
1. Continue Aricept  daily and Celexa  daily 2. Continue physical exercise, brain stimulation exercises, for brain health 3. Follow-up in 1 year, call for any changes

## 2014-09-26 ENCOUNTER — Encounter: Payer: Self-pay | Admitting: Neurology

## 2015-04-07 ENCOUNTER — Ambulatory Visit (INDEPENDENT_AMBULATORY_CARE_PROVIDER_SITE_OTHER): Payer: Medicare PPO | Admitting: Neurology

## 2015-04-07 ENCOUNTER — Encounter: Payer: Self-pay | Admitting: Neurology

## 2015-04-07 VITALS — BP 132/80 | HR 61 | Ht 62.5 in | Wt 143.0 lb

## 2015-04-07 DIAGNOSIS — R413 Other amnesia: Secondary | ICD-10-CM

## 2015-04-07 DIAGNOSIS — F03A0 Unspecified dementia, mild, without behavioral disturbance, psychotic disturbance, mood disturbance, and anxiety: Secondary | ICD-10-CM

## 2015-04-07 DIAGNOSIS — F329 Major depressive disorder, single episode, unspecified: Secondary | ICD-10-CM

## 2015-04-07 DIAGNOSIS — F0390 Unspecified dementia without behavioral disturbance: Secondary | ICD-10-CM | POA: Diagnosis not present

## 2015-04-07 DIAGNOSIS — F32A Depression, unspecified: Secondary | ICD-10-CM

## 2015-04-07 MED ORDER — DONEPEZIL HCL 10 MG PO TABS: ORAL_TABLET | ORAL | Status: DC

## 2015-04-07 MED ORDER — CITALOPRAM HYDROBROMIDE 10 MG PO TABS: 10.0000 mg | ORAL_TABLET | Freq: Every day | ORAL | Status: DC

## 2015-04-07 NOTE — Progress Notes (Signed)
NEUROLOGY FOLLOW UP OFFICE NOTE  Rachel Garza [id number]  HISTORY OF PRESENT ILLNESS: I had the pleasure of seeing Rachel Garza in follow-up in the neurology clinic on 09/24/2014.  The patient was last seen 3 months ago for worsening memory. She is again accompanied by her daughter who helps supplement the history today.MMSE in April 2016 was 25/30. On one of her visits, her daughter felt her mother was more depressed, more emotional and easily frustrated. She was started on Celexa  daily. She presents today due to family concern that she is worsening, family is concerned about her driving. She feels she is doing well, however she took a longer time getting home one time. She continues to take Aricept  daily without side effects. She denies any headaches, dizziness, diplopia, focal numbness/tingling/weakness. No falls.   HPI: This is a 75 yo RH woman with hyperlipidemia, hepatitis, depression, who presented with memory changes that she started to notice in 2015. She could not recall conversations and people would tell her "I just told you that." She frequently misplaces things. She denies getting lost driving, but occasionally has a hard time figuring her way out. She is usually able to make it out. She now has to set out her medications to remember to take them. She has some problems multitasking. She denies leaving the stove on or forgetting recipes. Her husband has always been in charge of bills. She denies any word-finding difficulties. Her father had memory problems. She had an MMSE at her PCP office last month, normal at 27/30.  Diagnostic Data: MRI brain without contrast showed mild diffuse atrophy, mild chronic microvascular disease, no acute changes.   PAST MEDICAL HISTORY: Past Medical History  Diagnosis Date  . Hyperlipidemia   . Osteopenia   . Hepatitis     MEDICATIONS: Current Outpatient Prescriptions on File Prior to Visit  Medication Sig Dispense Refill  .  aspirin 81 MG tablet Take 81 mg by mouth daily.    . citalopram (CELEXA) 10 MG tablet Take 1 tablet (10 mg total) by mouth daily. 90 tablet 3  . donepezil (ARICEPT) 10 MG tablet Take 1 tablet daily 90 tablet 3  . mometasone (NASONEX) 50 MCG/ACT nasal spray Place 2 sprays into the nose daily. 17 g 12  . Multiple Vitamins-Minerals (MULTIVITAMIN PO) Take 1 tablet by mouth daily.     . Omega-3 Fatty Acids (FISH OIL PO) Take 1 tablet by mouth daily.     . simvastatin (ZOCOR) 20 MG tablet Take 20 mg by mouth daily.     No current facility-administered medications on file prior to visit.    ALLERGIES: Allergies  Allergen Reactions  . Niacin And Related     FAMILY HISTORY: Family History  Problem Relation Age of Onset  . Coronary artery disease Mother   . Coronary artery disease Father   . Diabetes Mellitus I Sister   . Cancer Sister   . Glaucoma Sister     SOCIAL HISTORY: Social History   Social History  . Marital Status: Married    Spouse Name: N/A  . Number of Children: N/A  . Years of Education: N/A   Occupational History  . Not on file.   Social History Main Topics  . Smoking status: Never Smoker   . Smokeless tobacco: Not on file  . Alcohol Use: No  . Drug Use: No  . Sexual Activity: Not on file   Other Topics Concern  . Not on file   Social  History Narrative    REVIEW OF SYSTEMS: Constitutional: No fevers, chills, or sweats, no generalized fatigue, change in appetite Eyes: No visual changes, double vision, eye pain Ear, nose and throat: No hearing loss, ear pain, nasal congestion, sore throat Cardiovascular: No chest pain, palpitations Respiratory:  No shortness of breath at rest or with exertion, wheezes GastrointestinaI: No nausea, vomiting, diarrhea, abdominal pain, fecal incontinence Genitourinary:  No dysuria, urinary retention or frequency Musculoskeletal:  No neck pain, back pain Integumentary: No rash, pruritus, skin lesions Neurological: as  above Psychiatric: No depression, insomnia, anxiety Endocrine: No palpitations, fatigue, diaphoresis, mood swings, change in appetite, change in weight, increased thirst Hematologic/Lymphatic:  No anemia, purpura, petechiae. Allergic/Immunologic: no itchy/runny eyes, nasal congestion, recent allergic reactions, rashes  PHYSICAL EXAM: Filed Vitals:   04/07/15 1526  BP: 132/80  Pulse: 61   General: No acute distress Head:  Normocephalic/atraumatic Neck: supple, no paraspinal tenderness, full range of motion Heart:  Regular rate and rhythm Lungs:  Clear to auscultation bilaterally Back: No paraspinal tenderness Skin/Extremities: No rash, no edema Neurological Exam: alert and oriented to person, place, and time. No aphasia or dysarthria. Fund of knowledge is appropriate.  Remote memory intact. 1/3 delayed recall. Attention and concentration are normal.    Able to name objects and repeat phrases. Cranial nerves: Pupils equal, round, reactive to light.  Fundoscopic exam unremarkable, no papilledema. Extraocular movements intact with no nystagmus. Visual fields full. Facial sensation intact. No facial asymmetry. Tongue, uvula, palate midline.  Motor: Bulk and tone normal, muscle strength 5/5 throughout with no pronator drift.  Sensation to light touch intact.  No extinction to double simultaneous stimulation.  Deep tendon reflexes 2+ throughout, toes downgoing.  Finger to nose testing intact.  Gait narrow-based and steady, able to tandem walk adequately.  Romberg negative.  IMPRESSION: This is a 75 yo RH woman with a hyperlipidemia, depression, who presented with memory changes since 2015. Her MMSE in April 2016 was 25/30, suggestive of mild cognitive impairment. MRI brain unremarkable. Continue Aricept  daily and Celexa  daily. We discussed driving, and recommendations for a driving assessment if family has concerns. We again discussed the importance of control of vascular risk factors,  physical exercise, and brain stimulation exercises for brain health. She will follow-up in 1 year and knows to call our office for any problems.   Thank you for allowing me to participate in her care.  Please do not hesitate to call for any questions or concerns.  The duration of this appointment visit was 14 minutes of face-to-face time with the patient.  Greater than 50% of this time was spent in counseling, explanation of diagnosis, planning of further management, and coordination of care.   Patrcia Dolly, M.D.   CC: Dr. Nehemiah Settle        NEUROLOGY FOLLOW UP OFFICE NOTE  Rachel Garza [id number]  HISTORY OF PRESENT ILLNESS: I had the pleasure of seeing Deloris Moger in follow-up in the neurology clinic on 04/07/2015.  The patient was last seen 6 months ago for worsening memory. She is again accompanied by her daughter who help supplement the history today.MMSE in April 2016 was 25/30.  Memory pretty good Got lost coming home from church, gone for : beginning of Jan Husband has told daughter about a couple of times 2 weeks ago, 2hrs after dark and still not home, raining home, panicked and called 911; then she got home and said she had just talked for a while Not sure she is  taking celexa Mood is good, up and down per husband  On her last visit, her daughter felt her mother was more depressed, more emotional and easily frustrated. She was started on Celexa 10mg  daily, which she is tolerating without side effects. The patient reports mood is good, her daughter feels things are stable. Her memory is unchanged. She continues to take Aricept 10mg  daily without side effects. She continues to drive without getting lost or turned around in familiar streets. She denies any headaches, dizziness, diplopia, focal numbness/tingling/weakness. No falls.   HPI: This is a 75 yo RH woman with hyperlipidemia, hepatitis, depression, who presented with memory changes that she started to notice in  2015. She could not recall conversations and people would tell her "I just told you that." She frequently misplaces things. She denies getting lost driving, but occasionally has a hard time figuring her way out. She is usually able to make it out. She now has to set out her medications to remember to take them. She has some problems multitasking. She denies leaving the stove on or forgetting recipes. Her husband has always been in charge of bills. She denies any word-finding difficulties. Her father had memory problems. She had an MMSE at her PCP office last month, normal at 27/30.  Diagnostic Data: MRI brain without contrast showed mild diffuse atrophy, mild chronic microvascular disease, no acute changes.   PAST MEDICAL HISTORY: Past Medical History  Diagnosis Date  . Hyperlipidemia   . Osteopenia   . Hepatitis     MEDICATIONS: Current Outpatient Prescriptions on File Prior to Visit  Medication Sig Dispense Refill  . aspirin 81 MG tablet Take 81 mg by mouth daily.    Marland Kitchen donepezil (ARICEPT) 10 MG tablet Take 1 tablet daily 90 tablet 3  . Multiple Vitamins-Minerals (MULTIVITAMIN PO) Take 1 tablet by mouth daily.     . Omega-3 Fatty Acids (FISH OIL PO) Take 1 tablet by mouth daily.     . simvastatin (ZOCOR) 20 MG tablet Take 20 mg by mouth daily.    . citalopram (CELEXA) 10 MG tablet Take 1 tablet (10 mg total) by mouth daily. 90 tablet 3  . mometasone (NASONEX) 50 MCG/ACT nasal spray Place 2 sprays into the nose daily. (Patient not taking: Reported on 04/07/2015) 17 g 12   No current facility-administered medications on file prior to visit.    ALLERGIES: Allergies  Allergen Reactions  . Niacin And Related     FAMILY HISTORY: Family History  Problem Relation Age of Onset  . Coronary artery disease Mother   . Coronary artery disease Father   . Diabetes Mellitus I Sister   . Cancer Sister   . Glaucoma Sister     SOCIAL HISTORY: Social History   Social History  . Marital  Status: Married    Spouse Name: N/A  . Number of Children: N/A  . Years of Education: N/A   Occupational History  . Not on file.   Social History Main Topics  . Smoking status: Never Smoker   . Smokeless tobacco: Not on file  . Alcohol Use: No  . Drug Use: No  . Sexual Activity: Not on file   Other Topics Concern  . Not on file   Social History Narrative    REVIEW OF SYSTEMS: Constitutional: No fevers, chills, or sweats, no generalized fatigue, change in appetite Eyes: No visual changes, double vision, eye pain Ear, nose and throat: No hearing loss, ear pain, nasal congestion, sore throat Cardiovascular:  No chest pain, palpitations Respiratory:  No shortness of breath at rest or with exertion, wheezes GastrointestinaI: No nausea, vomiting, diarrhea, abdominal pain, fecal incontinence Genitourinary:  No dysuria, urinary retention or frequency Musculoskeletal:  No neck pain, back pain Integumentary: No rash, pruritus, skin lesions Neurological: as above Psychiatric: No depression, insomnia, anxiety Endocrine: No palpitations, fatigue, diaphoresis, mood swings, change in appetite, change in weight, increased thirst Hematologic/Lymphatic:  No anemia, purpura, petechiae. Allergic/Immunologic: no itchy/runny eyes, nasal congestion, recent allergic reactions, rashes  PHYSICAL EXAM: Filed Vitals:   04/07/15 1526  BP: 132/80  Pulse: 61   General: No acute distress Head:  Normocephalic/atraumatic Neck: supple, no paraspinal tenderness, full range of motion Heart:  Regular rate and rhythm Lungs:  Clear to auscultation bilaterally Back: No paraspinal tenderness Skin/Extremities: No rash, no edema Neurological Exam: alert and oriented to person, place, and time. No aphasia or dysarthria. Fund of knowledge is appropriate.  Remote memory intact. 1/3 delayed recall. Attention and concentration are normal.    Able to name objects and repeat phrases. CDT 5/5 MMSE - Mini Mental State  Exam 04/07/2015 06/24/2014  Orientation to time 2 3  Orientation to Place 5 5  Registration 3 3  Attention/ Calculation 4 4  Recall 1 1  Language- name 2 objects 2 2  Language- repeat 1 1  Language- follow 3 step command 3 3  Language- read & follow direction 1 1  Write a sentence 1 1  Copy design 1 1  Total score 24 25   Cranial nerves: Pupils equal, round, reactive to light.  Fundoscopic exam unremarkable, no papilledema. Extraocular movements intact with no nystagmus. Visual fields full. Facial sensation intact. No facial asymmetry. Tongue, uvula, palate midline.  Motor: Bulk and tone normal, muscle strength 5/5 throughout with no pronator drift.  Sensation to light touch intact.  No extinction to double simultaneous stimulation.  Deep tendon reflexes 2+ throughout, toes downgoing.  Finger to nose testing intact.  Gait narrow-based and steady, able to tandem walk adequately.  Romberg negative.  IMPRESSION: This is a 75 yo RH woman with a hyperlipidemia, depression, who presented with memory changes since 2015. Her MMSE today is 24/30, (25/30 in April 2016), again indicating mild cognitive impairment, however by history more suggestive of mild dementia, likely Alzheimer's type. MRI brain unremarkable. Continue Aricept 10mg  daily and Celexa 10mg  daily. We again had an extensive discussion about driving concerns, family was given information for an OT driving evaluation. They were also given resources for dementia education. We again discussed the importance of control of vascular risk factors, physical exercise, and brain stimulation exercises for brain health. She will follow-up in 1 year and knows to call our office for any problems.   Thank you for allowing me to participate in her care.  Please do not hesitate to call for any questions or concerns.  The duration of this appointment visit was 14 minutes of face-to-face time with the patient.  Greater than 50% of this time was spent in  counseling, explanation of diagnosis, planning of further management, and coordination of care.   Patrcia Dolly, M.D.   CC: Dr. Nehemiah Settle

## 2015-04-07 NOTE — Patient Instructions (Addendum)
1. Strongly recommend having a driving evaluation done 2. Continue Aricept  daily 3. Continue Celexa  daily 4. Recommended resources:  [facility] [address] [place], [place] [address] P: [phone]  Recommended Books:  1) The 36-Hour Day: A Family Guide to Caring for People Who Have Alzheimer Disease, Related Dementias, and Memory Loss (5th edition) by Lelon Huh. Mace and Bettye Boeck Rabins  2) Understanding Difficult Behaviors: Some practical suggestions for coping with Alzheimer's disease and related illnesses by Laurine Blazer and Ricardo Jericho  3) A Caregiver's Guide to Dementia: Using Activities and Other Strategies to Prevent, Reduce, and Manage Behavioral Symptoms by Mahala Menghini Bethena Midget and [employer]

## 2015-04-13 DIAGNOSIS — F03A0 Unspecified dementia, mild, without behavioral disturbance, psychotic disturbance, mood disturbance, and anxiety: Secondary | ICD-10-CM | POA: Insufficient documentation

## 2015-04-13 DIAGNOSIS — F0390 Unspecified dementia without behavioral disturbance: Secondary | ICD-10-CM | POA: Insufficient documentation

## 2015-09-27 ENCOUNTER — Ambulatory Visit: Payer: Medicare Other | Admitting: Neurology

## 2015-12-09 ENCOUNTER — Ambulatory Visit (INDEPENDENT_AMBULATORY_CARE_PROVIDER_SITE_OTHER): Payer: Medicare PPO | Admitting: Primary Care

## 2015-12-09 ENCOUNTER — Encounter: Payer: Self-pay | Admitting: Primary Care

## 2015-12-09 ENCOUNTER — Ambulatory Visit: Payer: Medicare PPO | Admitting: Primary Care

## 2015-12-09 VITALS — BP 126/74 | HR 57 | Temp 97.4°F | Ht 61.5 in | Wt 141.0 lb

## 2015-12-09 DIAGNOSIS — F0390 Unspecified dementia without behavioral disturbance: Secondary | ICD-10-CM | POA: Diagnosis not present

## 2015-12-09 DIAGNOSIS — Z23 Encounter for immunization: Secondary | ICD-10-CM | POA: Diagnosis not present

## 2015-12-09 DIAGNOSIS — F03A0 Unspecified dementia, mild, without behavioral disturbance, psychotic disturbance, mood disturbance, and anxiety: Secondary | ICD-10-CM

## 2015-12-09 DIAGNOSIS — E785 Hyperlipidemia, unspecified: Secondary | ICD-10-CM

## 2015-12-09 NOTE — Assessment & Plan Note (Signed)
Managed on Zocor, will obtain records for last lipid panel and LFT's.

## 2015-12-09 NOTE — Assessment & Plan Note (Signed)
Managed on Celexa for a short period of time. She has not taken this medication in months. Denies symptoms of depression and anxiety.

## 2015-12-09 NOTE — Progress Notes (Signed)
Pre visit review using our clinic review tool, if applicable. No additional management support is needed unless otherwise documented below in the visit note. 

## 2015-12-09 NOTE — Addendum Note (Signed)
Addended by: Tawnya CrookSAMBATH, Tanaysha Alkins on: 12/09/2015 09:13 AM   Modules accepted: Orders

## 2015-12-09 NOTE — Patient Instructions (Signed)
You will be contacted regarding your referral to Neurology.  Please let us know if you have not heard back within one week.   I will obtain records from Dr. Nehemiah SettlePolite and determine our next follow up date.  It was a pleasure to meet you today! Please don't hesitate to call me with any questions. Welcome to [facility]!

## 2015-12-09 NOTE — Assessment & Plan Note (Signed)
Currently following with neurology and is managed on Aricept. Family would like second opinion, referral placed to neurology. Exam unremarkable, patient active with HPI, alert, oriented.

## 2015-12-09 NOTE — Progress Notes (Signed)
Subjective:    Patient ID: Rachel Garza, female    DOB: 07-06-1940, 75 y.o.   MRN: [id number]  HPI  Rachel Garza is a 75 year old female who presents today to establish care and discuss the problems mentioned below. Will obtain old records. Her last physical exam was "a while ago".  1) Dementia: Mild. Currently managed on Apricept 10 mg per Neurology, currently follows with Dr. Karel Jarvis through [facility]. The family would like a different neurologist for a second opinion and further evaluation of dementia. The patient experiences short term memory loss and will ask repetitive questions. She lives at home with her husband. Denies long term memory loss. Family denies odd behavior.  2) Hyperlipidemia: Diagnosed several years ago. Currently managed on Simvastatin 20 mg and Fish Oil. She is unsure of the timing of her last lipid panel. Denies myalgias.   3) GAD: Previously managed on Citalopram 10 mg for which was initiated in early 2017. She's not had her medication in several months and neither the patient or family are sure of why she stopped taking it. Denies depression, anxiety, irritability, SI/HI.  Review of Systems  Constitutional: Negative for unexpected weight change.  Respiratory: Negative for shortness of breath.   Cardiovascular: Negative for chest pain.  Neurological: Negative for dizziness and weakness.  Psychiatric/Behavioral: Negative for agitation, confusion and suicidal ideas. The patient is not nervous/anxious.        Short term memory loss       Past Medical History:  Diagnosis Date  . Dementia   . Hepatitis   . Hyperlipidemia   . Osteopenia      Social History   Social History  . Marital status: Married    Spouse name: N/A  . Number of children: N/A  . Years of education: N/A   Occupational History  . Not on file.   Social History Main Topics  . Smoking status: Never Smoker  . Smokeless tobacco: Not on file  . Alcohol use No  . Drug use: No  . Sexual  activity: Not on file   Other Topics Concern  . Not on file   Social History Narrative   Married.   3 children. 1 grandchild.   Retired. Once worked as a Youth worker.   Enjoys gardening, spending time with family.    Past Surgical History:  Procedure Laterality Date  . UMBILICAL HERNIA REPAIR    . VAGINAL HYSTERECTOMY      Family History  Problem Relation Age of Onset  . Coronary artery disease Mother   . Coronary artery disease Father   . Diabetes Mellitus I Sister   . Cancer Sister   . Glaucoma Sister     Allergies  Allergen Reactions  . Niacin And Related     Current Outpatient Prescriptions on File Prior to Visit  Medication Sig Dispense Refill  . aspirin 81 MG tablet Take 81 mg by mouth daily.    Marland Kitchen donepezil (ARICEPT) 10 MG tablet Take 1 tablet daily 90 tablet 3  . Multiple Vitamins-Minerals (MULTIVITAMIN PO) Take 1 tablet by mouth daily.     . Omega-3 Fatty Acids (FISH OIL PO) Take 1 tablet by mouth daily.     . simvastatin (ZOCOR) 20 MG tablet Take 20 mg by mouth daily.     No current facility-administered medications on file prior to visit.     BP 126/74   Pulse (!) 57   Temp 97.4 F (36.3 C) (Oral)   Ht  5' 1.5" (1.562 m)   Wt 141 lb (64 kg)   SpO2 96%   BMI 26.21 kg/m    Objective:   Physical Exam  Constitutional: She is oriented to person, place, and time. She appears well-nourished.  Eyes: Pupils are equal, round, and reactive to light.  Neck: Neck supple.  Cardiovascular: Normal rate and regular rhythm.   Pulmonary/Chest: Effort normal and breath sounds normal.  Neurological: She is alert and oriented to person, place, and time. No cranial nerve deficit.  Skin: Skin is warm and dry.  Psychiatric: She has a normal mood and affect.          Assessment & Plan:

## 2015-12-21 ENCOUNTER — Encounter: Payer: Self-pay | Admitting: Primary Care

## 2015-12-31 ENCOUNTER — Ambulatory Visit (INDEPENDENT_AMBULATORY_CARE_PROVIDER_SITE_OTHER): Payer: Medicare PPO | Admitting: Neurology

## 2015-12-31 ENCOUNTER — Encounter: Payer: Self-pay | Admitting: Neurology

## 2015-12-31 VITALS — BP 136/72 | HR 52 | Ht 61.5 in | Wt 141.5 lb

## 2015-12-31 DIAGNOSIS — F0390 Unspecified dementia without behavioral disturbance: Secondary | ICD-10-CM

## 2015-12-31 DIAGNOSIS — R5382 Chronic fatigue, unspecified: Secondary | ICD-10-CM | POA: Diagnosis not present

## 2015-12-31 DIAGNOSIS — F03A0 Unspecified dementia, mild, without behavioral disturbance, psychotic disturbance, mood disturbance, and anxiety: Secondary | ICD-10-CM

## 2015-12-31 DIAGNOSIS — E538 Deficiency of other specified B group vitamins: Secondary | ICD-10-CM | POA: Diagnosis not present

## 2015-12-31 MED ORDER — MEMANTINE HCL 28 X 5 MG & 21 X 10 MG PO TABS: ORAL_TABLET | ORAL | 0 refills | Status: DC

## 2015-12-31 NOTE — Progress Notes (Signed)
Reason for visit: Memory disturbance  Referring physician: Dr. Jake Sampleslark  Rachel Garza is a 75 y.o. female  History of present illness:  Rachel Garza is a 75 year old right-handed white female with a history of memory disturbance that began around 2015. The patient has been noted to be somewhat forgetful. She is still operates a Librarian, academicmotor vehicle, but she has had significant issues with directions. She has had several occasions where the family has called 911 because they could not locate her. At this point, the family has GPS locators on her purse and her car. Within the last month or 2, the family is now involved with keeping up with appointments. The patient has a pill dispenser and she is able to manage her own medications. She is cooking without difficulty. She will misplace things about the house frequently. She denies any significant issues with word finding. She may repeat herself at times. The patient at times will get upset and slightly agitated, but this is not a big issue. She indicates that she has a good energy level, she sleeps well at night. She denies any problems with balance or falls. She denies any numbness or weakness of the face, arms, or legs. There have been no problems with control the bowels or the bladder. MRI of the brain showed mild small vessel ischemic changes and some atrophy. The patient been placed on Aricept and she is tolerating the medication well. She has been seen and evaluated by Dr. Karel JarvisAquino previously. The family comes here for another opinion regarding the memory issues. The family believes that she is progressing with her memory rapidly. There is no significant family history of memory problems, the patient's father had Parkinson's disease and memory problems after age 74.  Past Medical History:  Diagnosis Date  . Dementia   . Hepatitis   . Hyperlipidemia   . Osteopenia     Past Surgical History:  Procedure Laterality Date  . UMBILICAL HERNIA REPAIR    .  VAGINAL HYSTERECTOMY      Family History  Problem Relation Age of Onset  . Coronary artery disease Mother   . Coronary artery disease Father   . Diabetes Mellitus I Sister   . Cancer Sister   . Glaucoma Sister     Social history:  reports that she has never smoked. She has never used smokeless tobacco. She reports that she does not drink alcohol or use drugs.  Medications:  Prior to Admission medications   Medication Sig Start Date End Date Taking? Authorizing Provider  aspirin 81 MG tablet Take 81 mg by mouth daily.   Yes Historical Provider, MD  BIOTIN 5000 PO Take 5,000 mcg by mouth daily.   Yes Historical Provider, MD  citalopram (CELEXA) 10 MG tablet Take 10 mg by mouth daily.   Yes Historical Provider, MD  donepezil (ARICEPT) 10 MG tablet Take 1 tablet daily 04/07/15  Yes Van ClinesKaren M Aquino, MD  Multiple Vitamins-Minerals (MULTIVITAMIN PO) Take 1 tablet by mouth daily.    Yes Historical Provider, MD  Omega-3 Fatty Acids (FISH OIL PO) Take 1 tablet by mouth daily.    Yes Historical Provider, MD  simvastatin (ZOCOR) 20 MG tablet Take 20 mg by mouth daily.   Yes Historical Provider, MD      Allergies  Allergen Reactions  . Niacin And Related     ROS:  Out of a complete 14 system review of symptoms, the patient complains only of the following symptoms, and all other  reviewed systems are negative.  Memory loss, confusion  Blood pressure 136/72, pulse (!) 52, height 5' 1.5" (1.562 m), weight 141 lb 8 oz (64.2 kg).  Physical Exam  General: The patient is alert and cooperative at the time of the examination.  Eyes: Pupils are equal, round, and reactive to light. Discs are flat bilaterally.  Neck: The neck is supple, no carotid bruits are noted.  Respiratory: The respiratory examination is clear.  Cardiovascular: The cardiovascular examination reveals a regular rate and rhythm, no obvious murmurs or rubs are noted.  Skin: Extremities are without significant  edema.  Neurologic Exam  Mental status: The patient is alert and oriented x 3 at the time of the examination. The Mini-Mental Status Examination done today shows a total score 27/30.  Cranial nerves: Facial symmetry is present. There is good sensation of the face to pinprick and soft touch bilaterally. The strength of the facial muscles and the muscles to head turning and shoulder shrug are normal bilaterally. Speech is well enunciated, no aphasia or dysarthria is noted. Extraocular movements are full. Visual fields are full. The tongue is midline, and the patient has symmetric elevation of the soft palate. No obvious hearing deficits are noted.  Motor: The motor testing reveals 5 over 5 strength of all 4 extremities. Good symmetric motor tone is noted throughout.  Sensory: Sensory testing is intact to pinprick, soft touch, vibration sensation, and position sense on all 4 extremities. No evidence of extinction is noted.  Coordination: Cerebellar testing reveals good finger-nose-finger and heel-to-shin bilaterally.  Gait and station: Gait is normal. Tandem gait is normal. Romberg is negative. No drift is seen.  Reflexes: Deep tendon reflexes are symmetric and normal bilaterally. Toes are downgoing bilaterally.   Assessment/Plan:  1. Memory disturbance  The patient has had significant issues with directions with driving. I have recommended the patient stop operating a motor vehicle at this time. The patient will be sent for blood work, Candiss Norseamenda will be added to the medication regimen. The family and the patient are interested in clinical research regarding Alzheimer's disease. I will make this referral. The patient will follow-up in 6 months.  Marlan Palau. Keith Willis MD 12/31/2015 12:11 PM  [facility] [address] [place], [place] [id number]  Phone [phone] Fax [phone]

## 2016-01-01 LAB — TSH: TSH: 2.08 u[IU]/mL (ref 0.450–4.500)

## 2016-01-01 LAB — SEDIMENTATION RATE: Sed Rate: 4 mm/hr (ref 0–40)

## 2016-01-01 LAB — VITAMIN B12: Vitamin B-12: 560 pg/mL (ref 211–946)

## 2016-01-01 LAB — RPR: RPR Ser Ql: NONREACTIVE

## 2016-01-25 ENCOUNTER — Other Ambulatory Visit: Payer: Self-pay | Admitting: Neurology

## 2016-01-25 ENCOUNTER — Other Ambulatory Visit: Payer: Self-pay | Admitting: *Deleted

## 2016-01-25 MED ORDER — MEMANTINE HCL 10 MG PO TABS: 10.0000 mg | ORAL_TABLET | Freq: Two times a day (BID) | ORAL | 0 refills | Status: DC

## 2016-02-04 ENCOUNTER — Ambulatory Visit (INDEPENDENT_AMBULATORY_CARE_PROVIDER_SITE_OTHER): Payer: Medicare PPO | Admitting: Primary Care

## 2016-02-04 ENCOUNTER — Encounter: Payer: Self-pay | Admitting: Primary Care

## 2016-02-04 VITALS — BP 134/84 | HR 67 | Temp 98.1°F | Ht 62.0 in | Wt 146.1 lb

## 2016-02-04 DIAGNOSIS — E2839 Other primary ovarian failure: Secondary | ICD-10-CM | POA: Diagnosis not present

## 2016-02-04 DIAGNOSIS — E785 Hyperlipidemia, unspecified: Secondary | ICD-10-CM | POA: Diagnosis not present

## 2016-02-04 DIAGNOSIS — F03A0 Unspecified dementia, mild, without behavioral disturbance, psychotic disturbance, mood disturbance, and anxiety: Secondary | ICD-10-CM

## 2016-02-04 DIAGNOSIS — F0390 Unspecified dementia without behavioral disturbance: Secondary | ICD-10-CM

## 2016-02-04 DIAGNOSIS — Z0000 Encounter for general adult medical examination without abnormal findings: Secondary | ICD-10-CM | POA: Diagnosis not present

## 2016-02-04 DIAGNOSIS — Z23 Encounter for immunization: Secondary | ICD-10-CM

## 2016-02-04 DIAGNOSIS — F3342 Major depressive disorder, recurrent, in full remission: Secondary | ICD-10-CM

## 2016-02-04 DIAGNOSIS — Z1239 Encounter for other screening for malignant neoplasm of breast: Secondary | ICD-10-CM

## 2016-02-04 DIAGNOSIS — Z1231 Encounter for screening mammogram for malignant neoplasm of breast: Secondary | ICD-10-CM

## 2016-02-04 MED ORDER — ZOSTER VACCINE LIVE 19400 UNT/0.65ML ~~LOC~~ SUSR: 0.6500 mL | Freq: Once | SUBCUTANEOUS | 0 refills | Status: AC

## 2016-02-04 NOTE — Progress Notes (Signed)
Pre visit review using our clinic review tool, if applicable. No additional management support is needed unless otherwise documented below in the visit note. 

## 2016-02-04 NOTE — Progress Notes (Signed)
Patient ID: Rachel LevyMartha A Garza, female   DOB: 04/28/1940, 75 y.o.   MRN: [id number]  HPI: [name] is a 75 year old female who presents today for her annual wellness visit.  Past Medical History:  Diagnosis Date  . Dementia   . Hepatitis   . Hyperlipidemia   . Osteopenia     Current Outpatient Prescriptions  Medication Sig Dispense Refill  . aspirin 81 MG tablet Take 81 mg by mouth daily.    Marland Kitchen. BIOTIN 5000 PO Take 5,000 mcg by mouth daily.    . citalopram (CELEXA) 10 MG tablet Take 10 mg by mouth daily.    Marland Kitchen. donepezil (ARICEPT) 10 MG tablet Take 1 tablet daily 90 tablet 3  . memantine (NAMENDA TITRATION PAK) tablet pack 5 mg/day for =1 week; 5 mg twice daily for =1 week; 15 mg/day given in 5 mg and 10 mg separated doses for =1 week; then 10 mg twice daily 49 tablet 0  . memantine (NAMENDA) 10 MG tablet Take 1 tablet (10 mg total) by mouth 2 (two) times daily. 60 tablet 0  . Multiple Vitamins-Minerals (MULTIVITAMIN PO) Take 1 tablet by mouth daily.     . Omega-3 Fatty Acids (FISH OIL PO) Take 1 tablet by mouth daily.     . simvastatin (ZOCOR) 20 MG tablet Take 20 mg by mouth daily.     No current facility-administered medications for this visit.     Allergies  Allergen Reactions  . Niacin And Related     Family History  Problem Relation Age of Onset  . Coronary artery disease Mother   . Coronary artery disease Father   . Diabetes Mellitus I Sister   . Cancer Sister   . Glaucoma Sister     Social History   Social History  . Marital status: Married    Spouse name: N/A  . Number of children: 3  . Years of education: 611   Occupational History  . Retired    Social History Main Topics  . Smoking status: Never Smoker  . Smokeless tobacco: Never Used  . Alcohol use No  . Drug use: No  . Sexual activity: Not on file   Other Topics Concern  . Not on file   Social History Narrative   Married.   3 children. 1 grandchild.   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    Enjoys gardening, spending time with family.   Right-handed   Caffeine: 2-3 cups coffee per day    Hospitiliaztions: None  Health Maintenance:    Flu: Completed in October 2017  Tetanus: Completed in 2008  Pneumovax: Completed in 2009  Prevnar: Completed in 2015  Zostavax: Never completed. Due.  Bone Density: Completed in 2015, osteopenia  Colonoscopy: Never completed. Interested in [employer].  Eye Doctor: Completed in 2017, no changes.  Dental Exam: Completes semi-annually  Mammogram: Completed in 2017, normal  Pap: Hysterectomy    Providers: Dr. Elmer PickerHecker; Optometrist; Dr. Anne HahnWillis, Neurology; Vernona RiegerKatherine Clark, PCP; Dr. Toni ArthursFuller, [place]   I have personally reviewed and have noted: 1. The patient's medical and social history 2. Their use of alcohol, tobacco or illicit drugs 3. Their current medications and supplements 4. The patient's functional ability including ADL's, fall risks, home safety  risks and hearing or visual impairment. 5. Diet and physical activities 6. Evidence for depression or mood disorder  Subjective:   Review of Systems:   Constitutional: Denies fever, malaise, fatigue, headache or abrupt weight changes.  HEENT: Denies eye  pain, eye redness, ear pain, ringing in the ears, wax buildup, runny nose, nasal congestion, bloody nose, or sore throat. Respiratory: Denies difficulty breathing, shortness of breath, cough or sputum production.   Cardiovascular: Denies chest pain, chest tightness, palpitations or swelling in the hands or feet.  Gastrointestinal: Denies abdominal pain, bloating, constipation, diarrhea or blood in the stool.  GU: Denies urgency, frequency, pain with urination, burning sensation, blood in urine, odor or discharge. Musculoskeletal: Denies decrease in range of motion, difficulty with gait, muscle pain or joint pain and swelling.  Skin: Denies redness, rashes, lesions or ulcercations.  Neurological: Denies dizziness, difficulty with speech or  problems with balance and coordination.   No other specific complaints in a complete review of systems (except as listed in HPI above).  Objective:  PE:   BP 134/84   Pulse 67   Temp 98.1 F (36.7 C) (Oral)   Ht 5\' 2"  (1.575 m)   Wt 146 lb 1.9 oz (66.3 kg)   SpO2 99%   BMI 26.73 kg/m  Wt Readings from Last 3 Encounters:  02/04/16 146 lb 1.9 oz (66.3 kg)  12/31/15 141 lb 8 oz (64.2 kg)  12/09/15 141 lb (64 kg)    General: Appears their stated age, well developed, well nourished in NAD. Skin: Warm, dry and intact. No rashes, lesions or ulcerations noted. HEENT: Head: normal shape and size; Eyes: sclera white, no icterus, conjunctiva pink, PERRLA and EOMs intact; Ears: Tm's gray and intact, normal light reflex; Nose: mucosa pink and moist, septum midline; Throat/Mouth: Teeth present, mucosa pink and moist, no exudate, lesions or ulcerations noted.  Neck: Normal range of motion. Neck supple, trachea midline. No massses, lumps or thyromegaly present.  Cardiovascular: Normal rate and rhythm. S1,S2 noted.  No murmur, rubs or gallops noted. No JVD or BLE edema. No carotid bruits noted. Pulmonary/Chest: Normal effort and positive vesicular breath sounds. No respiratory distress. No wheezes, rales or ronchi noted.  Abdomen: Soft and nontender. Normal bowel sounds, no bruits noted. No distention or masses noted. Liver, spleen and kidneys non palpable. Musculoskeletal: Normal range of motion. No signs of joint swelling. No difficulty with gait.  Neurological: Alert and oriented. Cranial nerves II-XII intact. Coordination normal. +DTRs bilaterally. Psychiatric: Mood and affect normal. Behavior is normal. Judgment and thought content normal.     BMET    Component Value Date/Time   NA 137 04/03/2014 1539   K 3.7 04/03/2014 1539   CL 103 04/03/2014 1539   CO2 30 04/03/2014 1539   GLUCOSE 127 (H) 04/03/2014 1539   BUN 14 04/03/2014 1539   CREATININE 0.77 04/03/2014 1539   CALCIUM 10.0  04/03/2014 1539   GFRNONAA 82 (L) 04/03/2014 1539   GFRAA >90 04/03/2014 1539    Lipid Panel  No results found for: CHOL, TRIG, HDL, CHOLHDL, VLDL, LDLCALC  CBC    Component Value Date/Time   WBC 9.0 04/03/2014 1539   RBC 4.26 04/03/2014 1539   HGB 13.5 04/03/2014 1539   HCT 39.9 04/03/2014 1539   PLT 179 04/03/2014 1539   MCV 93.7 04/03/2014 1539   MCH 31.7 04/03/2014 1539   MCHC 33.8 04/03/2014 1539   RDW 13.0 04/03/2014 1539   LYMPHSABS 0.9 04/03/2014 1539   MONOABS 0.4 04/03/2014 1539   EOSABS 0.0 04/03/2014 1539   BASOSABS 0.0 04/03/2014 1539    Hgb A1C No results found for: HGBA1C    Assessment and Plan:    Medicare Annual Wellness Visit:  Diet: She endorses a  fair diet. Breakfast: Cereal Lunch: Soups, protein, sandwiches Dinner: Vegetables, soups, sandwiches Snacks: Occasionally ice cream Desserts: Occasionally Beverages: Coffee, milk, water, occasional juice/sodas Physical activity: Active, she does not currently exercise. Depression/mood screen: Negative, well managed on Citalopram Hearing: Intact to whispered voice Visual acuity: Grossly normal, performs annual eye exam  ADLs: Capable Fall risk: None Home safety: Good Cognitive evaluation: Intact to orientation, naming, recall and repetition EOL planning: Adv directives, unsure, packet provided. Full code/ I agree  Preventative Medicine: Immunizations UTD except for Zostavax, she will check with insurance regarding coverage. Rx printed for Zostavax for her to obtain from her pharmacy. Mammogram and Bone Density testing due, ordered. Never completed colonoscopy, opts to try Cologuard, information provided. Discussed to increase vegetables, fruit, whole grains. Discussed importance of daily exercise such as walking. Advance directives packet provided. All recommendations provided at end of visit.  Next appointment: Follow up in 1 year for annual physical.

## 2016-02-04 NOTE — Patient Instructions (Addendum)
Schedule a lab only appointment at your convenience. Ensure that you come fasting 4 hours prior to your appointment. You may have water and black coffee.  Check with your insurance company regarding the shingles vaccination. Take the prescription to your pharmacy for administration.  Cologuard will be in touch with you soon regarding screening for colon cancer.  [facility] schedule your bone density test and mammogram.  Increase consumption of fresh vegetables, fruit, whole grains, water.  Start exercising. You should be getting 150 minutes of exercise weekly.  Follow up in 1 year for your annual physical or sooner if needed!  It was a pleasure to see you today!

## 2016-02-05 DIAGNOSIS — Z0000 Encounter for general adult medical examination without abnormal findings: Secondary | ICD-10-CM | POA: Insufficient documentation

## 2016-02-05 NOTE — Assessment & Plan Note (Signed)
Following with neurology. Continue Namenda. Alert and oriented and involved in HPI today.

## 2016-02-05 NOTE — Assessment & Plan Note (Signed)
Stable on Citalopram, continue same. 

## 2016-02-05 NOTE — Assessment & Plan Note (Signed)
Lipids pending. Continue simvastatin.

## 2016-02-05 NOTE — Assessment & Plan Note (Signed)
Immunizations UTD except for Zostavax, she will check with insurance regarding coverage. Rx printed for Zostavax for her to obtain from her pharmacy. Mammogram and Bone Density testing due, ordered. Never completed colonoscopy, opts to try Cologuard, information provided. Discussed to increase vegetables, fruit, whole grains. Discussed importance of daily exercise such as walking. Advance directives packet provided. All recommendations provided at end of visit.  I have personally reviewed and have noted: 1. The patient's medical and social history 2. Their use of alcohol, tobacco or illicit drugs 3. Their current medications and supplements 4. The patient's functional ability including ADL's, fall risks, home safety  risks and hearing or visual impairment. 5. Diet and physical activities 6. Evidence for depression or mood disorder

## 2016-02-23 ENCOUNTER — Other Ambulatory Visit: Payer: Self-pay | Admitting: Neurology

## 2016-03-10 ENCOUNTER — Telehealth: Payer: Self-pay | Admitting: Primary Care

## 2016-03-10 ENCOUNTER — Emergency Department (HOSPITAL_COMMUNITY): Payer: Medicare PPO

## 2016-03-10 ENCOUNTER — Emergency Department (HOSPITAL_COMMUNITY)
Admission: EM | Admit: 2016-03-10 | Discharge: 2016-03-10 | Disposition: A | Discharge status: Home / Self Care | Payer: Medicare PPO | Attending: Emergency Medicine | Admitting: Emergency Medicine

## 2016-03-10 DIAGNOSIS — Z79899 Other long term (current) drug therapy: Secondary | ICD-10-CM | POA: Insufficient documentation

## 2016-03-10 DIAGNOSIS — Z7982 Long term (current) use of aspirin: Secondary | ICD-10-CM | POA: Diagnosis not present

## 2016-03-10 DIAGNOSIS — H6121 Impacted cerumen, right ear: Secondary | ICD-10-CM

## 2016-03-10 DIAGNOSIS — R42 Dizziness and giddiness: Secondary | ICD-10-CM | POA: Diagnosis present

## 2016-03-10 DIAGNOSIS — H8110 Benign paroxysmal vertigo, unspecified ear: Secondary | ICD-10-CM | POA: Diagnosis not present

## 2016-03-10 LAB — DIFFERENTIAL
BASOS PCT: 0 %
Basophils Absolute: 0 10*3/uL (ref 0.0–0.1)
Eosinophils Absolute: 0 10*3/uL (ref 0.0–0.7)
Eosinophils Relative: 1 %
Lymphocytes Relative: 18 %
Lymphs Abs: 1.2 10*3/uL (ref 0.7–4.0)
MONOS PCT: 8 %
Monocytes Absolute: 0.5 10*3/uL (ref 0.1–1.0)
Neutro Abs: 4.8 10*3/uL (ref 1.7–7.7)
Neutrophils Relative %: 73 %

## 2016-03-10 LAB — CBC
HEMATOCRIT: 44.2 % (ref 36.0–46.0)
Hemoglobin: 15.1 g/dL — ABNORMAL HIGH (ref 12.0–15.0)
MCH: 31.2 pg (ref 26.0–34.0)
MCHC: 34.2 g/dL (ref 30.0–36.0)
MCV: 91.3 fL (ref 78.0–100.0)
Platelets: 253 10*3/uL (ref 150–400)
RBC: 4.84 MIL/uL (ref 3.87–5.11)
RDW: 12.9 % (ref 11.5–15.5)
WBC: 6.5 10*3/uL (ref 4.0–10.5)

## 2016-03-10 LAB — COMPREHENSIVE METABOLIC PANEL
ALT: 17 U/L (ref 14–54)
AST: 28 U/L (ref 15–41)
Albumin: 3.9 g/dL (ref 3.5–5.0)
Alkaline Phosphatase: 91 U/L (ref 38–126)
Anion gap: 8 (ref 5–15)
BUN: 18 mg/dL (ref 6–20)
CALCIUM: 9.4 mg/dL (ref 8.9–10.3)
CO2: 27 mmol/L (ref 22–32)
CREATININE: 0.79 mg/dL (ref 0.44–1.00)
Chloride: 105 mmol/L (ref 101–111)
GFR calc Af Amer: >60 mL/min (ref >60)
Glucose, Bld: 88 mg/dL (ref 65–99)
Potassium: 4.2 mmol/L (ref 3.5–5.1)
Sodium: 140 mmol/L (ref 135–145)
Total Bilirubin: 0.5 mg/dL (ref 0.3–1.2)
Total Protein: 6.8 g/dL (ref 6.5–8.1)

## 2016-03-10 LAB — CBG MONITORING, ED: Glucose-Capillary: 82 mg/dL (ref 65–99)

## 2016-03-10 LAB — I-STAT TROPONIN, ED: TROPONIN I, POC: 0 ng/mL (ref 0.00–0.08)

## 2016-03-10 MED ORDER — MECLIZINE HCL 25 MG PO TABS: 25.0000 mg | ORAL_TABLET | Freq: Three times a day (TID) | ORAL | 0 refills | Status: DC | PRN

## 2016-03-10 MED ORDER — DIPHENHYDRAMINE HCL 50 MG/ML IJ SOLN
25.0000 mg | Freq: Once | INTRAMUSCULAR | Status: DC
  Filled 2016-03-10: qty 1

## 2016-03-10 MED ORDER — METOCLOPRAMIDE HCL 5 MG/ML IJ SOLN
10.0000 mg | Freq: Once | INTRAMUSCULAR | Status: DC
  Filled 2016-03-10: qty 2

## 2016-03-10 NOTE — ED Notes (Signed)
Per Dr. Clydene PughKnott, patient's symptoms could be caused by a buildup of ear wax.  With this information, the patient would like to hold off on receiving the reglan and benadryl to see if removing the wax helps with her symptoms.

## 2016-03-10 NOTE — Telephone Encounter (Signed)
[facility] Client TELEPHONE ADVICE RECORD [facility] Patient Name: Rachel Garza DOB: 11/23/1940 Initial Comment Caller states mother is dizzy and head is hurting on right side. Also has ringing in her ears and is nauseous. Nurse Assessment Nurse: Ladona RidgelGaddy, RN, Felicia Date/Time (Eastern Time): 03/10/2016 4:05:12 PM Confirm and document reason for call. If symptomatic, describe symptoms. ---PT has mild HA and more of a ringing and tendersness down R temple and face - and dizziness onset this am. She woke up with it. Does the patient have any new or worsening symptoms? ---Yes Will a triage be completed? ---Yes Related visit to physician within the last 2 weeks? ---No Does the PT have any chronic conditions? (i.e. diabetes, asthma, etc.) ---NoIs this a behavioral health or substance abuse call? ---No Guidelines Guideline Title Affirmed Question Affirmed Notes Traumatic Brain Injury More than 14 Days Ago Follow-up Call [1] TBI symptoms are worsening AND [2] age > 60 years Final Disposition User Go to ED Now (or PCP triage) Ladona RidgelGaddy, RN, Sunny SchleinFelicia Comments facial pain on R is mild - dizziness is same level as HA and nausea no chest pain severe concussion 10 y ago off a ladder and was knocked out minor strokes in the past TIA TIAs were not recent made decision ER better than MD office in this case bc of herhistory - she is walking around normally now but was stumbling this am Referrals [facility] Disagree/Comply: Comply Call Id: [phone]

## 2016-03-10 NOTE — ED Provider Notes (Signed)
[facility]-EMERGENCY DEPT Provider Note   CSN: [id number] Arrival date & time: 03/10/16  1656     History   Chief Complaint Chief Complaint  Patient presents with  . Visual Field Change    HPI Rachel Garza is a 76 y.o. female.  The history is provided by the patient and a relative.  Dizziness  Quality:  Room spinning and imbalance Severity:  Severe Onset quality:  Sudden Duration:  1 hour Timing:  Intermittent Progression:  Resolved Chronicity:  Recurrent Context: standing up   Context: not with loss of consciousness   Relieved by:  Nothing Worsened by:  Nothing Ineffective treatments:  None tried Associated symptoms: headaches and vision changes (brief with dizziness felt as if she couldn't see upper visual fields)   Associated symptoms: no shortness of breath, no syncope and no vomiting   Risk factors: hx of vertigo     Past Medical History:  Diagnosis Date  . Dementia   . Hepatitis   . Hyperlipidemia   . Osteopenia     Patient Active Problem List   Diagnosis Date Noted  . Medicare annual wellness visit, subsequent 02/05/2016  . Mild dementia 04/13/2015  . Mild cognitive impairment 06/24/2014  . Depression 06/24/2014  . Hyperlipidemia     Past Surgical History:  Procedure Laterality Date  . UMBILICAL HERNIA REPAIR    . VAGINAL HYSTERECTOMY      OB History    No data available       Home Medications    Prior to Admission medications   Medication Sig Start Date End Date Taking? Authorizing Provider  aspirin 81 MG tablet Take 81 mg by mouth daily.   Yes Historical Provider, MD  BIOTIN 5000 PO Take 5,000 mcg by mouth daily.   Yes Historical Provider, MD  citalopram (CELEXA) 10 MG tablet Take 10 mg by mouth daily.   Yes Historical Provider, MD  memantine (NAMENDA) 10 MG tablet TAKE 1 TABLET (10 MG TOTAL) BY MOUTH 2 (TWO) TIMES DAILY. 02/23/16  Yes York Spaniel, MD  Multiple Vitamins-Minerals (MULTIVITAMIN PO) Take 1 tablet by mouth daily.    Yes  Historical Provider, MD  Omega-3 Fatty Acids (FISH OIL PO) Take 1 tablet by mouth daily.    Yes Historical Provider, MD  simvastatin (ZOCOR) 20 MG tablet Take 20 mg by mouth daily.   Yes Historical Provider, MD  donepezil (ARICEPT) 10 MG tablet Take 1 tablet daily 04/07/15   Van Clines, MD    Family History Family History  Problem Relation Age of Onset  . Coronary artery disease Mother   . Coronary artery disease Father   . Diabetes Mellitus I Sister   . Cancer Sister   . Glaucoma Sister     Social History Social History  Substance Use Topics  . Smoking status: Never Smoker  . Smokeless tobacco: Never Used  . Alcohol use No     Allergies   Niacin and related   Review of Systems Review of Systems  Respiratory: Negative for shortness of breath.   Cardiovascular: Negative for syncope.  Gastrointestinal: Negative for vomiting.  Neurological: Positive for dizziness and headaches.  All other systems reviewed and are negative.    Physical Exam Updated Vital Signs BP 132/58 (BP Location: Left Arm)   Pulse (!) 58   Temp 97.9 F (36.6 C) (Oral)   Resp 18   SpO2 99%   Physical Exam  Constitutional: She is oriented to person, place, and time. She appears  well-developed and well-nourished. No distress.  HENT:  Head: Normocephalic.  Right Ear: Tympanic membrane normal. No middle ear effusion.  Left Ear: Tympanic membrane normal.  No middle ear effusion.  Nose: Nose normal.  Eyes: Conjunctivae are normal.  Neck: Neck supple. No tracheal deviation present.  Cardiovascular: Normal rate, regular rhythm and normal heart sounds.   Pulmonary/Chest: Effort normal and breath sounds normal. No respiratory distress.  Abdominal: Soft. She exhibits no distension.  Musculoskeletal: Normal range of motion.  Neurological: She is alert and oriented to person, place, and time. No cranial nerve deficit or sensory deficit. She exhibits normal muscle tone. Coordination normal.  Normal  finger to nose testing and rapid alternating movement. Normal heel to shin.   Skin: Skin is warm and dry.  Psychiatric: She has a normal mood and affect.  Vitals reviewed.    ED Treatments / Results  Labs (all labs ordered are listed, but only abnormal results are displayed) Labs Reviewed  CBC - Abnormal; Notable for the following:       Result Value   Hemoglobin 15.1 (*)    All other components within normal limits  DIFFERENTIAL  COMPREHENSIVE METABOLIC PANEL  I-STAT TROPOININ, ED  CBG MONITORING, ED    EKG  EKG Interpretation  Date/Time:  Friday March 10 2016 17:38:10 EST Ventricular Rate:  62 PR Interval:    QRS Duration: 91 QT Interval:  393 QTC Calculation: 399 R Axis:   77 Text Interpretation:  Sinus rhythm Normal ECG No significant change since last tracing Confirmed by Yuvia Plant MD, Pericles Carmicheal [phone]) on 03/10/2016 7:18:12 PM       Radiology Ct Head Wo Contrast  Result Date: 03/10/2016 CLINICAL DATA:  Blurred vision.  Headache. Nausea. EXAM: CT HEAD WITHOUT CONTRAST TECHNIQUE: Contiguous axial images were obtained from the base of the skull through the vertex without intravenous contrast. COMPARISON:  MRI 04/03/2014.  CT 04/03/2014. FINDINGS: Brain: Mild low density in the periventricular white matter likely related to small vessel disease. This is slightly progressive since the prior. No mass lesion, hemorrhage, hydrocephalus, acute infarct, intra-axial, or extra-axial fluid collection. Vascular: No hyperdense vessel or unexpected calcification. Skull: Normal Sinuses/Orbits: Normal orbits and globes. Near complete opacification of the right maxillary sinus, chronic. Hypoplastic frontal sinuses. Clear mastoid air cells. Cerumen in the right external ear canal. Other: Normal IMPRESSION: 1.  No acute intracranial abnormality. 2. Small vessel ischemic change. 3. Chronic sinus disease. Electronically Signed   By: Jeronimo GreavesKyle  Talbot M.D.   On: 03/10/2016 19:21    Procedures .Ear  Cerumen Removal Date/Time: 03/11/2016 2:52 AM Performed by: Lyndal PulleyKNOTT, Hollis Tuller Authorized by: Lyndal PulleyKNOTT, Bradie Lacock   Consent:    Consent obtained:  Verbal   Consent given by:  Patient   Alternatives discussed:  No treatment Procedure details:    Location:  R ear   Procedure type: curette   Post-procedure details:    Inspection:  TM intact   Hearing quality:  Improved   Patient tolerance of procedure:  Tolerated well, no immediate complications   (including critical care time)  Medications Ordered in ED Medications - No data to display   Initial Impression / Assessment and Plan / ED Course  I have reviewed the triage vital signs and the nursing notes.  Pertinent labs & imaging results that were available during my care of the patient were reviewed by me and considered in my medical decision making (see chart for details).  Clinical Course     76 y.o. female presents with dizziness  and gait problem this morning. Reported upper visual field vision difficulty which has since resolved. Normal neuro exam here and dizziness has resolved with residual right ear tinnitus and headache. Unable to visualize TM d/t cerumen impaction which was removed and no signs of OM. Hearing improved with cerumen removal. CT and lab workup negative. No significant hematologic or metabolic abnormalities to explain symptoms. Discussed symptoms with neurology who agreed upper visual fields are unlikely related to central cause with lack of residual symptoms. Doubt retinal detachment as vision has completely resolved with resolution of dizziness and Pt is poor historian d/t mild dementia. Symptoms likely related to vertigo which patient has had previously. Recently had upper respiratory symptoms so may be 2/2 viral vestibulitis. Recommended NSAIDs and tylenol for headaches. Plan to follow up with PCP as needed and return precautions discussed for worsening or new concerning symptoms.   Final Clinical Impressions(s) / ED  Diagnoses   Final diagnoses:  Benign paroxysmal positional vertigo, unspecified laterality  Hearing loss of right ear due to cerumen impaction    New Prescriptions Discharge Medication List as of 03/10/2016  9:46 PM    START taking these medications   Details  meclizine (ANTIVERT) 25 MG tablet Take 1 tablet (25 mg total) by mouth 3 (three) times daily as needed for dizziness., Starting Fri 03/10/2016, Print         Lyndal Pulley, MD 03/11/16 [phone]

## 2016-03-10 NOTE — ED Triage Notes (Addendum)
Pt c/o bilateral blurred top of vision fields with seeing spots, headache radiating back from right eye, shaking hands, weak feeling, trembling, nausea onset 1000 today. Tinnitus on right side, all other symptoms bilateral. Headache, tinnitus worsened 30 minutes ago, all other symptoms stable since 1000 today. No new symptoms since 1000 today. No new confusion, has early Alzheimers. A & O x 4. Nystagmus with diagonal eye movement down and left. CN II-XII otherwise intact. Rapid alternating movements and proprioception intact. Negatic Rhomberg. Sensation equal bilaterally. Gait unsteady but symmetrical.

## 2016-03-10 NOTE — ED Notes (Signed)
Earlene Plateravis RN at bedside triaging pt.

## 2016-03-10 NOTE — Telephone Encounter (Signed)
Noted, chart reviewed, she has been triaged.

## 2016-03-10 NOTE — Telephone Encounter (Signed)
Per chart review tab pt went to [facility] ED. 

## 2016-03-28 ENCOUNTER — Ambulatory Visit: Payer: Medicare PPO | Admitting: Primary Care

## 2016-03-30 ENCOUNTER — Encounter: Payer: Self-pay | Admitting: Primary Care

## 2016-03-31 ENCOUNTER — Encounter: Payer: Self-pay | Admitting: Primary Care

## 2016-03-31 ENCOUNTER — Ambulatory Visit (INDEPENDENT_AMBULATORY_CARE_PROVIDER_SITE_OTHER): Payer: Medicare PPO | Admitting: Primary Care

## 2016-03-31 VITALS — BP 124/72 | HR 72 | Temp 97.8°F | Ht 62.0 in | Wt 139.0 lb

## 2016-03-31 DIAGNOSIS — H938X2 Other specified disorders of left ear: Secondary | ICD-10-CM

## 2016-03-31 DIAGNOSIS — H6123 Impacted cerumen, bilateral: Secondary | ICD-10-CM | POA: Diagnosis not present

## 2016-03-31 MED ORDER — CEPHALEXIN 500 MG PO CAPS: 500.0000 mg | ORAL_CAPSULE | Freq: Three times a day (TID) | ORAL | 0 refills | Status: DC

## 2016-03-31 NOTE — Progress Notes (Signed)
Pre visit review using our clinic review tool, if applicable. No additional management support is needed unless otherwise documented below in the visit note. 

## 2016-03-31 NOTE — Progress Notes (Signed)
Subjective:    Patient ID: Rachel Garza, female    DOB: 1940-08-19, 76 y.o.   MRN: [id number]  HPI  Rachel Garza is a 76 year old female who presents today for Emergency Department Follow up.  She presented to [facility] on 03/10/16 with a chief complaint of dizziness with feeling like the room was spinning and feeling off balance.   During her stay in the ED she underwent: ECG with NSR, no changes since last ECG; lab work (Troponin, CBC, CMP) which were unremarkable; CT head which did not show acute stroke and was stable. Her neuro exam was unremarkable in the ED except for cerumen impaction to bilateral ears. TM's post removal of cerumen without evidence of infection, but was unable to get all of the cerumen. She was discharged home later that day with a prescription for Meclizine tablets.  Since her ED visit she's not experienced any dizziness. She was told that there was some cerumen that the ED doctor could not get to. Over the last several days she's been using Debrox drops to help soften the residual wax. She denies dizziness, ear pain, sore throat, cough.  Review of Systems  Constitutional: Negative for fever.  HENT: Negative for congestion, ear pain and sore throat.   Eyes: Negative for visual disturbance.  Neurological: Negative for dizziness.       Past Medical History:  Diagnosis Date  . Dementia   . Hepatitis   . Hyperlipidemia   . Osteopenia      Social History   Social History  . Marital status: Married    Spouse name: N/A  . Number of children: 3  . Years of education: 53   Occupational History  . Retired    Social History Main Topics  . Smoking status: Never Smoker  . Smokeless tobacco: Never Used  . Alcohol use No  . Drug use: No  . Sexual activity: Not on file   Other Topics Concern  . Not on file   Social History Narrative   Married.   3 children. 1 grandchild.   Retired. Once worked as a Youth worker.   Enjoys gardening, spending time with  family.   Right-handed   Caffeine: 2-3 cups coffee per day    Past Surgical History:  Procedure Laterality Date  . UMBILICAL HERNIA REPAIR    . VAGINAL HYSTERECTOMY      Family History  Problem Relation Age of Onset  . Coronary artery disease Mother   . Coronary artery disease Father   . Diabetes Mellitus I Sister   . Cancer Sister   . Glaucoma Sister     Allergies  Allergen Reactions  . Niacin And Related     Current Outpatient Prescriptions on File Prior to Visit  Medication Sig Dispense Refill  . aspirin 81 MG tablet Take 81 mg by mouth daily.    Marland Kitchen BIOTIN 5000 PO Take 5,000 mcg by mouth daily.    . citalopram (CELEXA) 10 MG tablet Take 10 mg by mouth daily.    Marland Kitchen donepezil (ARICEPT) 10 MG tablet Take 1 tablet daily 90 tablet 3  . memantine (NAMENDA) 10 MG tablet TAKE 1 TABLET (10 MG TOTAL) BY MOUTH 2 (TWO) TIMES DAILY. 60 tablet 5  . Multiple Vitamins-Minerals (MULTIVITAMIN PO) Take 1 tablet by mouth daily.     . Omega-3 Fatty Acids (FISH OIL PO) Take 1 tablet by mouth daily.     . simvastatin (ZOCOR) 20 MG tablet Take 20 mg  by mouth daily.     No current facility-administered medications on file prior to visit.     BP 124/72   Pulse 72   Temp 97.8 F (36.6 C) (Oral)   Ht 5\' 2"  (1.575 m)   Wt 139 lb (63 kg)   SpO2 98%   BMI 25.42 kg/m    Objective:   Physical Exam  Constitutional: She is oriented to person, place, and time. She appears well-nourished.  HENT:  Right Ear: Tympanic membrane and ear canal normal.  Left Ear: Tympanic membrane and ear canal normal.  Nose: Right sinus exhibits no maxillary sinus tenderness and no frontal sinus tenderness. Left sinus exhibits no maxillary sinus tenderness and no frontal sinus tenderness.  Mouth/Throat: Oropharynx is clear and moist.  Mild cerumen impaction bilaterally. Left TM post irrigation with moderate blood, irritation. Right TM unremarkable.  Eyes: Conjunctivae and EOM are normal.  Neck: Neck supple.    Cardiovascular: Normal rate and regular rhythm.   Pulmonary/Chest: Effort normal and breath sounds normal. She has no wheezes. She has no rales.  Lymphadenopathy:    She has no cervical adenopathy.  Neurological: She is alert and oriented to person, place, and time. No cranial nerve deficit.  Skin: Skin is warm and dry.          Assessment & Plan:  ED Follow Up:  [facility] Full stroke work up negative, positive for bilateral cerumen impaction. Ears were cleaned, but not irrigated, cerumen remains. Irrigation completed today to bilateral ears. Moderate bleeding to canal of left side post irrigation, difficult to see TM. Will treat prophylactic with Keflex course. Follow up in 1 week for recheck.   All hospital labs, notes, and imaging reviewed. Morrie Sheldonlark,Toshiyuki Fredell Kendal, NP

## 2016-03-31 NOTE — Patient Instructions (Addendum)
Start cephalexin antibiotics to prevent infection. Take 1 capsule by mouth three times daily for 7 days.  Please follow up in 1 week for re-evaluation.  It was a pleasure to see you today!

## 2016-04-03 ENCOUNTER — Other Ambulatory Visit: Payer: Self-pay | Admitting: Primary Care

## 2016-04-03 MED ORDER — SIMVASTATIN 20 MG PO TABS: 20.0000 mg | ORAL_TABLET | Freq: Every day | ORAL | 0 refills | Status: DC

## 2016-04-03 NOTE — Telephone Encounter (Signed)
Received faxed refill request for simvastatin (ZOCOR) 20 MG tablet. Medication has not been prescribed by Jae DireKate yet. Last seen on 03/31/2016.

## 2016-04-06 LAB — COLOGUARD

## 2016-04-07 ENCOUNTER — Ambulatory Visit (INDEPENDENT_AMBULATORY_CARE_PROVIDER_SITE_OTHER): Payer: Medicare PPO | Admitting: Primary Care

## 2016-04-07 ENCOUNTER — Ambulatory Visit: Payer: Medicare Other | Admitting: Neurology

## 2016-04-07 ENCOUNTER — Encounter: Payer: Self-pay | Admitting: Primary Care

## 2016-04-07 VITALS — BP 118/74 | HR 77 | Temp 97.7°F | Ht 62.0 in | Wt 142.4 lb

## 2016-04-07 DIAGNOSIS — H6122 Impacted cerumen, left ear: Secondary | ICD-10-CM

## 2016-04-07 NOTE — Progress Notes (Signed)
Pre visit review using our clinic review tool, if applicable. No additional management support is needed unless otherwise documented below in the visit note. 

## 2016-04-07 NOTE — Telephone Encounter (Signed)
Message left for patient to return my call on 04/04/2016 an 04/06/2016

## 2016-04-07 NOTE — Progress Notes (Signed)
Subjective:    Patient ID: Rachel Garza, female    DOB: 06/29/1940, 76 y.o.   MRN: [id number]  HPI  Rachel Garza is a 76 year old female who presents today for follow up of ear fullness. Last visit she was noted to have bilateral cerumen impaction, she underwent irrigation which resulted in moderate bleeding to the left canal. Cerumen unable to be fully disimpacted due to bleeding. Given bleeding and cerumen in place, TM's were unable to be visualized. She was placed on Cephalexin to prevent infection given trauma with irrigation.  Since her last visit she denies bleeding, ear pain, fevers. She's been compliant to her cephalexin. She heard a popping noise several days ago and has had better hearing since.  Review of Systems  Constitutional: Negative for fever.  HENT: Negative for ear pain.   Skin: Negative for color change.        Past Medical History:  Diagnosis Date  . Dementia   . Hepatitis   . Hyperlipidemia   . Osteopenia      Social History   Social History  . Marital status: Married    Spouse name: N/A  . Number of children: 3  . Years of education: 10711   Occupational History  . Retired    Social History Main Topics  . Smoking status: Never Smoker  . Smokeless tobacco: Never Used  . Alcohol use No  . Drug use: No  . Sexual activity: Not on file   Other Topics Concern  . Not on file   Social History Narrative   Married.   3 children. 1 grandchild.   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   Enjoys gardening, spending time with family.   Right-handed   Caffeine: 2-3 cups coffee per day    Past Surgical History:  Procedure Laterality Date  . UMBILICAL HERNIA REPAIR    . VAGINAL HYSTERECTOMY      Family History  Problem Relation Age of Onset  . Coronary artery disease Mother   . Coronary artery disease Father   . Diabetes Mellitus I Sister   . Cancer Sister   . Glaucoma Sister     Allergies  Allergen Reactions  . Niacin And Related      Current Outpatient Prescriptions on File Prior to Visit  Medication Sig Dispense Refill  . aspirin 81 MG tablet Take 81 mg by mouth daily.    Marland Kitchen. BIOTIN 5000 PO Take 5,000 mcg by mouth daily.    . cephALEXin (KEFLEX) 500 MG capsule Take 1 capsule (500 mg total) by mouth 3 (three) times daily. 21 capsule 0  . citalopram (CELEXA) 10 MG tablet Take 10 mg by mouth daily.    Marland Kitchen. donepezil (ARICEPT) 10 MG tablet Take 1 tablet daily 90 tablet 3  . memantine (NAMENDA) 10 MG tablet TAKE 1 TABLET (10 MG TOTAL) BY MOUTH 2 (TWO) TIMES DAILY. 60 tablet 5  . Multiple Vitamins-Minerals (MULTIVITAMIN PO) Take 1 tablet by mouth daily.     . Omega-3 Fatty Acids (FISH OIL PO) Take 1 tablet by mouth daily.     . simvastatin (ZOCOR) 20 MG tablet Take 1 tablet (20 mg total) by mouth daily. 30 tablet 0   No current facility-administered medications on file prior to visit.     BP 118/74   Pulse 77   Temp 97.7 F (36.5 C) (Oral)   Ht 5\' 2"  (1.575 m)   Wt 142 lb 6.4 oz (64.6 kg)  SpO2 97%   BMI 26.05 kg/m    Objective:   Physical Exam  Constitutional: She appears well-nourished.  HENT:  Right Ear: Tympanic membrane and ear canal normal.  Moderate impaction to left canal. Able to remove large amount of cerumen with instrumentation, however, TM still not visible given remaining impaction.  Neck: Neck supple.  Cardiovascular: Normal rate.   Pulmonary/Chest: Effort normal.  Skin: Skin is warm and dry.          Assessment & Plan:  Cerumen Impaction:  Still present to left canal. Large amount of cerumen removed today, but still impacted and unable to reach with instrumentation. Given trauma from irrigation last visit, will not irrigate today. Discussed use of debrox drops. She will return if ear wax does not come out. No s/s of infection. Finish antibiotics.  Rachel Sheldon, NP

## 2016-04-07 NOTE — Patient Instructions (Signed)
Start debrox drops or use an ear wax removal kit.   Please notify us if your symptoms return.  Complete the cephalexin as prescribed.  It was a pleasure to see you today!   Earwax Buildup Your ears make a substance called earwax. It may also be called cerumen. Sometimes, too much earwax builds up in your ear canal. This can cause ear pain and make it harder for you to hear. CAUSES This condition is caused by too much earwax production or buildup. RISK FACTORS The following factors may make you more likely to develop this condition:  Cleaning your ears often with swabs.  Having narrow ear canals.  Having earwax that is overly thick or sticky.  Having eczema.  Being dehydrated. SYMPTOMS Symptoms of this condition include:  Reduced hearing.  Ear drainage.  Ear pain.  Ear itch.  A feeling of fullness in the ear or feeling that the ear is plugged.  Ringing in the ear.  Coughing. DIAGNOSIS Your health care provider can diagnose this condition based on your symptoms and medical history. Your health care provider will also do an ear exam to look inside your ear with a scope (otoscope). You may also have a hearing test. TREATMENT Treatment for this condition includes:  Over-the-counter or prescription ear drops to soften the earwax.  Earwax removal by a health care provider. This may be done:  By flushing the ear with body-temperature water.  With a medical instrument that has a loop at the end (earwax curette).  With a suction device. HOME CARE INSTRUCTIONS  Take over-the-counter and prescription medicines only as told by your health care provider.  Do not put any objects, including an ear swab, into your ear. You can clean the opening of your ear canal with a washcloth.  Drink enough water to keep your urine clear or pale yellow.  If you have frequent earwax buildup or you use hearing aids, consider seeing your health care provider every 6-12 months for routine  preventive ear cleanings. Keep all follow-up visits as told by your health care provider. SEEK MEDICAL CARE IF:  You have ear pain.  Your condition does not improve with treatment.  You have hearing loss.  You have blood, pus, or other fluid coming from your ear. This information is not intended to replace advice given to you by your health care provider. Make sure you discuss any questions you have with your health care provider. Document Released: 03/23/2004 Document Revised: 06/07/2015 Document Reviewed: 09/30/2014 Elsevier Interactive Patient Education  2017 [employer].

## 2016-04-21 ENCOUNTER — Ambulatory Visit
Admission: RE | Admit: 2016-04-21 | Discharge: 2016-04-21 | Disposition: A | Discharge status: Home / Self Care | Payer: Medicare PPO | Source: Ambulatory Visit | Attending: Primary Care | Admitting: Primary Care

## 2016-04-21 DIAGNOSIS — Z1239 Encounter for other screening for malignant neoplasm of breast: Secondary | ICD-10-CM

## 2016-04-21 DIAGNOSIS — E2839 Other primary ovarian failure: Secondary | ICD-10-CM

## 2016-04-25 ENCOUNTER — Other Ambulatory Visit: Payer: Self-pay | Admitting: Primary Care

## 2016-04-26 ENCOUNTER — Other Ambulatory Visit: Payer: Self-pay | Admitting: Primary Care

## 2016-04-26 DIAGNOSIS — M810 Age-related osteoporosis without current pathological fracture: Secondary | ICD-10-CM

## 2016-04-26 MED ORDER — ALENDRONATE SODIUM 70 MG PO TABS: ORAL_TABLET | ORAL | 3 refills | Status: DC

## 2016-05-02 ENCOUNTER — Other Ambulatory Visit: Payer: Self-pay | Admitting: Primary Care

## 2016-05-10 ENCOUNTER — Other Ambulatory Visit: Payer: Medicare PPO

## 2016-05-16 ENCOUNTER — Other Ambulatory Visit (INDEPENDENT_AMBULATORY_CARE_PROVIDER_SITE_OTHER): Payer: Medicare PPO

## 2016-05-16 DIAGNOSIS — E785 Hyperlipidemia, unspecified: Secondary | ICD-10-CM | POA: Diagnosis not present

## 2016-05-16 LAB — LIPID PANEL
CHOLESTEROL: 170 mg/dL (ref 0–200)
HDL: 73.5 mg/dL (ref >39.00)
LDL CALC: 87 mg/dL (ref 0–99)
NONHDL: 96.54
Total CHOL/HDL Ratio: 2
Triglycerides: 50 mg/dL (ref 0.0–149.0)
VLDL: 10 mg/dL (ref 0.0–40.0)

## 2016-05-16 LAB — COMPREHENSIVE METABOLIC PANEL
ALBUMIN: 3.6 g/dL (ref 3.5–5.2)
ALT: 19 U/L (ref 0–35)
AST: 27 U/L (ref 0–37)
Alkaline Phosphatase: 88 U/L (ref 39–117)
BUN: 12 mg/dL (ref 6–23)
CHLORIDE: 104 meq/L (ref 96–112)
CO2: 32 mEq/L (ref 19–32)
Calcium: 9 mg/dL (ref 8.4–10.5)
Creatinine, Ser: 0.74 mg/dL (ref 0.40–1.20)
GFR: 81.44 mL/min (ref >60.00)
Glucose, Bld: 90 mg/dL (ref 70–99)
POTASSIUM: 3.7 meq/L (ref 3.5–5.1)
SODIUM: 140 meq/L (ref 135–145)
Total Bilirubin: 0.6 mg/dL (ref 0.2–1.2)
Total Protein: 6.3 g/dL (ref 6.0–8.3)

## 2016-05-19 ENCOUNTER — Encounter: Payer: Self-pay | Admitting: Primary Care

## 2016-05-19 ENCOUNTER — Ambulatory Visit (INDEPENDENT_AMBULATORY_CARE_PROVIDER_SITE_OTHER): Payer: Medicare PPO | Admitting: Primary Care

## 2016-05-19 VITALS — BP 120/78 | HR 89 | Temp 97.5°F | Ht 62.0 in | Wt 140.0 lb

## 2016-05-19 DIAGNOSIS — H6122 Impacted cerumen, left ear: Secondary | ICD-10-CM

## 2016-05-19 NOTE — Progress Notes (Signed)
Subjective:    Patient ID: Rachel Garza, female    DOB: 11/29/1940, 76 y.o.   MRN: [id number]  HPI  [name] is a 76 year old female with a history of cerumen impaction who presents today with a chief complaint of ear fullness. Her ears were last irrigated about 1 month ago with moderate to severe impaction.   Since her last visit she was never able to get the residual cerumen out. She's using the Debrox drops twice daily since her last visit. She denies pain, sore throat, fevers, chills.   Review of Systems  Constitutional: Negative for fever.  HENT: Positive for tinnitus. Negative for ear pain and sore throat.        Ear fullness  Respiratory: Negative for cough.        Past Medical History:  Diagnosis Date  . Dementia   . Hepatitis   . Hyperlipidemia   . Osteopenia      Social History   Social History  . Marital status: Married    Spouse name: N/A  . Number of children: 3  . Years of education: 5911   Occupational History  . Retired    Social History Main Topics  . Smoking status: Never Smoker  . Smokeless tobacco: Never Used  . Alcohol use No  . Drug use: No  . Sexual activity: Not on file   Other Topics Concern  . Not on file   Social History Narrative   Married.   3 children. 1 grandchild.   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   Enjoys gardening, spending time with family.   Right-handed   Caffeine: 2-3 cups coffee per day    Past Surgical History:  Procedure Laterality Date  . UMBILICAL HERNIA REPAIR    . VAGINAL HYSTERECTOMY      Family History  Problem Relation Age of Onset  . Coronary artery disease Mother   . Coronary artery disease Father   . Diabetes Mellitus I Sister   . Cancer Sister   . Glaucoma Sister   . Breast cancer Maternal Aunt     Allergies  Allergen Reactions  . Niacin And Related     Current Outpatient Prescriptions on File Prior to Visit  Medication Sig Dispense Refill  . alendronate (FOSAMAX) 70 MG  tablet Take 1 tablet by mouth once every 7 days. Take with a full glass of water on an empty stomach. 12 tablet 3  . aspirin 81 MG tablet Take 81 mg by mouth daily.    Marland Kitchen. BIOTIN 5000 PO Take 5,000 mcg by mouth daily.    . cephALEXin (KEFLEX) 500 MG capsule Take 1 capsule (500 mg total) by mouth 3 (three) times daily. 21 capsule 0  . citalopram (CELEXA) 10 MG tablet Take 10 mg by mouth daily.    Marland Kitchen. donepezil (ARICEPT) 10 MG tablet Take 1 tablet daily 90 tablet 3  . memantine (NAMENDA) 10 MG tablet TAKE 1 TABLET (10 MG TOTAL) BY MOUTH 2 (TWO) TIMES DAILY. 60 tablet 5  . Multiple Vitamins-Minerals (MULTIVITAMIN PO) Take 1 tablet by mouth daily.     . Omega-3 Fatty Acids (FISH OIL PO) Take 1 tablet by mouth daily.     . simvastatin (ZOCOR) 20 MG tablet TAKE 1 TABLET BY MOUTH EVERY DAY 30 tablet 0   No current facility-administered medications on file prior to visit.     BP 120/78   Pulse 89   Temp 97.5 F (36.4 C)  Ht 5\' 2"  (1.575 m)   Wt 140 lb (63.5 kg)   SpO2 95%   BMI 25.61 kg/m    Objective:   Physical Exam  Constitutional: She appears well-nourished.  HENT:  Right Ear: Tympanic membrane and ear canal normal.  Left Ear: Ear canal normal.  Nose: Right sinus exhibits no maxillary sinus tenderness and no frontal sinus tenderness. Left sinus exhibits no maxillary sinus tenderness and no frontal sinus tenderness.  Mouth/Throat: Oropharynx is clear and moist.  Cerumen impaction to left canal. TM post irrigation unremarkable. Moderate amount of cerumen removed.  Eyes: Conjunctivae are normal.  Neck: Neck supple.  Cardiovascular: Normal rate and regular rhythm.   Pulmonary/Chest: Effort normal and breath sounds normal. She has no wheezes. She has no rales.  Lymphadenopathy:    She has no cervical adenopathy.  Skin: Skin is warm and dry.          Assessment & Plan:  Cerumen Impaction:   Bilateral canals irrigated 3-4 weeks ago. Unable to completely remove cerumen due to  severe impaction. Using Debrox drops since. Moderate impaction to left canal which was easily removed through irrigation today. No s/s of acute infection or irritation. Discussed prevention. Follow up PRN.  Rachel Sheldon, NP

## 2016-05-19 NOTE — Patient Instructions (Signed)
Nasal Congestion/Ear Pressure/Sinus Pressure: Try using Flonase (fluticasone) nasal spray. Instill 1 spray in each nostril twice daily. This may help with the ringing in your ears.  It was a pleasure to see you today!

## 2016-05-30 ENCOUNTER — Other Ambulatory Visit: Payer: Self-pay | Admitting: Primary Care

## 2016-05-30 ENCOUNTER — Other Ambulatory Visit: Payer: Self-pay | Admitting: Neurology

## 2016-05-30 DIAGNOSIS — F329 Major depressive disorder, single episode, unspecified: Secondary | ICD-10-CM

## 2016-05-30 DIAGNOSIS — F32A Depression, unspecified: Secondary | ICD-10-CM

## 2016-06-01 ENCOUNTER — Other Ambulatory Visit: Payer: Self-pay | Admitting: Neurology

## 2016-06-01 ENCOUNTER — Telehealth: Payer: Self-pay | Admitting: Neurology

## 2016-06-01 DIAGNOSIS — F32A Depression, unspecified: Secondary | ICD-10-CM

## 2016-06-01 DIAGNOSIS — F329 Major depressive disorder, single episode, unspecified: Secondary | ICD-10-CM

## 2016-06-01 MED ORDER — CITALOPRAM HYDROBROMIDE 10 MG PO TABS: 10.0000 mg | ORAL_TABLET | Freq: Every day | ORAL | 3 refills | Status: DC

## 2016-06-01 NOTE — Telephone Encounter (Signed)
Pt's daughter request refill for citalopram (CELEXA) 10 MG tablet [facility]. Pt is out of the medication. She said the pharmacy sent request to Dr Karel Jarvis but all medications need to go thru Dr Anne Hahn.

## 2016-06-01 NOTE — Telephone Encounter (Signed)
The Celexa will be refilled.

## 2016-06-22 ENCOUNTER — Other Ambulatory Visit: Payer: Self-pay | Admitting: Neurology

## 2016-06-22 DIAGNOSIS — R413 Other amnesia: Secondary | ICD-10-CM

## 2016-06-22 NOTE — Telephone Encounter (Signed)
Pt needs appointment with provider for further refills

## 2016-07-07 ENCOUNTER — Ambulatory Visit: Payer: Medicare PPO | Admitting: Neurology

## 2016-07-10 ENCOUNTER — Ambulatory Visit (INDEPENDENT_AMBULATORY_CARE_PROVIDER_SITE_OTHER): Payer: Medicare PPO | Admitting: Neurology

## 2016-07-10 ENCOUNTER — Encounter: Payer: Self-pay | Admitting: Neurology

## 2016-07-10 VITALS — BP 114/75 | HR 49 | Ht 64.0 in | Wt 142.0 lb

## 2016-07-10 DIAGNOSIS — G3184 Mild cognitive impairment, so stated: Secondary | ICD-10-CM

## 2016-07-10 MED ORDER — DONEPEZIL HCL 23 MG PO TABS: 23.0000 mg | ORAL_TABLET | Freq: Every day | ORAL | 5 refills | Status: DC

## 2016-07-10 NOTE — Progress Notes (Signed)
Reason for visit: Memory disturbance  Rachel LevyMartha A Garza is an 76 y.o. female  History of present illness:  Rachel Garza is a 76 year old right-handed white female with a history of a progressive memory disturbance. The patient lives at home with her husband, she gets help with keeping up with medications and appointments, she does not cook at this time, apparently she is still operating a motor vehicle, but her husband rides with her to ensure that she does not get lost. The patient does some yard work and housework. The husband does the finances. The patient indicates that she sleeps well at night, she has a fairly good appetite, she is not losing weight. She was placed on Namenda on her last visit, she is tolerating the medication well. The family indicates that she continues to progress with her memory.  Past Medical History:  Diagnosis Date  . Dementia   . Hepatitis   . Hyperlipidemia   . Osteopenia     Past Surgical History:  Procedure Laterality Date  . UMBILICAL HERNIA REPAIR    . VAGINAL HYSTERECTOMY      Family History  Problem Relation Age of Onset  . Coronary artery disease Mother   . Coronary artery disease Father   . Diabetes Mellitus I Sister   . Cancer Sister   . Glaucoma Sister   . Breast cancer Maternal Aunt     Social history:  reports that she has never smoked. She has never used smokeless tobacco. She reports that she does not drink alcohol or use drugs.    Allergies  Allergen Reactions  . Niacin And Related     Medications:  Prior to Admission medications   Medication Sig Start Date End Date Taking? Authorizing Provider  alendronate (FOSAMAX) 70 MG tablet Take 1 tablet by mouth once every 7 days. Take with a full glass of water on an empty stomach. 04/26/16  Yes Doreene Nestlark, Katherine K, NP  aspirin 81 MG tablet Take 81 mg by mouth daily.   Yes [provider]  BIOTIN 5000 PO Take 5,000 mcg by mouth daily.   Yes [provider]  citalopram  (CELEXA) 10 MG tablet TAKE 1 TABLET (10 MG TOTAL) BY MOUTH DAILY. 06/01/16  Yes York SpanielWillis, Charles K, MD  donepezil (ARICEPT) 10 MG tablet TAKE 1 TABLET DAILY 06/22/16  Yes Van ClinesAquino, Karen M, MD  memantine (NAMENDA) 10 MG tablet TAKE 1 TABLET (10 MG TOTAL) BY MOUTH 2 (TWO) TIMES DAILY. 02/23/16  Yes York SpanielWillis, Charles K, MD  Multiple Vitamins-Minerals (MULTIVITAMIN PO) Take 1 tablet by mouth daily.    Yes [provider]  Omega-3 Fatty Acids (FISH OIL PO) Take 1 tablet by mouth daily.    Yes [provider]  simvastatin (ZOCOR) 20 MG tablet TAKE 1 TABLET BY MOUTH EVERY DAY 05/30/16  Yes Doreene Nestlark, Katherine K, NP    ROS:  Out of a complete 14 system review of symptoms, the patient complains only of the following symptoms, and all other reviewed systems are negative.  Ringing in the ears, runny nose Leg swelling Back pain Environmental allergies Memory loss  Blood pressure 114/75, pulse (!) 49, height 5\' 4"  (1.626 m), weight 142 lb (64.4 kg).  Physical Exam  General: The patient is alert and cooperative at the time of the examination.  Skin: No significant peripheral edema is noted.   Neurologic Exam  Mental status: The patient is alert and oriented x 2 at the time of the examination (not oriented to  date). The Mini-Mental Status Examination done today shows a total score of 25/30.   Cranial nerves: Facial symmetry is present. Speech is normal, no aphasia or dysarthria is noted. Extraocular movements are full. Visual fields are full.  Motor: The patient has good strength in all 4 extremities.  Sensory examination: Soft touch sensation is symmetric on the face, arms, and legs.  Coordination: The patient has good finger-nose-finger and heel-to-shin bilaterally.  Gait and station: The patient has a normal gait. Tandem gait is normal. Romberg is negative. No drift is seen.  Reflexes: Deep tendon reflexes are symmetric.   Assessment/Plan:  1. Progressive memory  disturbance  The patient apparently is still operating a motor vehicle, but she does this only when someone is in the car with her. The patient will be increased on the Aricept taking the 23 mg extended-release tablet, one tablet daily. The patient will follow-up in 6 months, sooner if needed.  Marlan Palau MD 07/10/2016 10:02 AM  [facility] [address] [place], [place] [address]  Phone [phone] Fax [phone]

## 2016-07-10 NOTE — Patient Instructions (Signed)
   Stop the donepezil 10 mg tablet, go to the 23 mg tablet daily.

## 2016-08-17 ENCOUNTER — Emergency Department (HOSPITAL_COMMUNITY)
Admission: EM | Admit: 2016-08-17 | Discharge: 2016-08-18 | Disposition: A | Discharge status: Home / Self Care | Payer: Medicare PPO | Attending: Emergency Medicine | Admitting: Emergency Medicine

## 2016-08-17 ENCOUNTER — Encounter (HOSPITAL_COMMUNITY): Payer: Self-pay

## 2016-08-17 DIAGNOSIS — Z79899 Other long term (current) drug therapy: Secondary | ICD-10-CM | POA: Diagnosis not present

## 2016-08-17 DIAGNOSIS — R0789 Other chest pain: Secondary | ICD-10-CM | POA: Insufficient documentation

## 2016-08-17 DIAGNOSIS — Z7982 Long term (current) use of aspirin: Secondary | ICD-10-CM | POA: Diagnosis not present

## 2016-08-17 LAB — CBC
HEMATOCRIT: 40.7 % (ref 36.0–46.0)
HEMOGLOBIN: 14.1 g/dL (ref 12.0–15.0)
MCH: 31.5 pg (ref 26.0–34.0)
MCHC: 34.6 g/dL (ref 30.0–36.0)
MCV: 90.8 fL (ref 78.0–100.0)
Platelets: 179 10*3/uL (ref 150–400)
RBC: 4.48 MIL/uL (ref 3.87–5.11)
RDW: 13.1 % (ref 11.5–15.5)
WBC: 6.1 10*3/uL (ref 4.0–10.5)

## 2016-08-17 LAB — URINALYSIS, ROUTINE W REFLEX MICROSCOPIC
BACTERIA UA: NONE SEEN
Bilirubin Urine: NEGATIVE
GLUCOSE, UA: NEGATIVE mg/dL
Hgb urine dipstick: NEGATIVE
KETONES UR: 20 mg/dL — AB
NITRITE: NEGATIVE
PH: 5 (ref 5.0–8.0)
PROTEIN: NEGATIVE mg/dL
Specific Gravity, Urine: 1.026 (ref 1.005–1.030)

## 2016-08-17 NOTE — ED Provider Notes (Signed)
[facility]ENC[facility] DEPT Provider Note: Lowella Dell, MD, FACEP  CSN: [id number] MRN: [id number] ARRIVAL: 08/17/16 at 1959 ROOM: WA18/WA18   CHIEF COMPLAINT  Chest Pain   HISTORY OF PRESENT ILLNESS  Rachel Garza is a 76 y.o. female with 2 days of pain in her chest and epigastrium. She localizes the pain to the left side of the lower sternum. She is unable to characterize it as anything but "pain" but rates it a 10 out of 10. The pain is not worse with exertion nor better with rest. The pain is not worse with deep breathing, moving or palpation. It is not changed with eating. There is no associated shortness of breath. She has had nausea for the past 2 or 3 days without vomiting or diarrhea. She has some cold sweats earlier this morning.    Past Medical History:  Diagnosis Date  . Dementia   . Hepatitis   . Hyperlipidemia   . Osteopenia     Past Surgical History:  Procedure Laterality Date  . UMBILICAL HERNIA REPAIR    . VAGINAL HYSTERECTOMY      Family History  Problem Relation Age of Onset  . Coronary artery disease Mother   . Coronary artery disease Father   . Diabetes Mellitus I Sister   . Cancer Sister   . Glaucoma Sister   . Breast cancer Maternal Aunt     Social History  Substance Use Topics  . Smoking status: Never Smoker  . Smokeless tobacco: Never Used  . Alcohol use No    Prior to Admission medications   Medication Sig Start Date End Date Taking? Authorizing Provider  alendronate (FOSAMAX) 70 MG tablet Take 1 tablet by mouth once every 7 days. Take with a full glass of water on an empty stomach. 04/26/16  Yes Doreene Nest, NP  aspirin 81 MG tablet Take 81 mg by mouth daily after breakfast.    Yes [provider]  BIOTIN 5000 PO Take 5,000 mcg by mouth daily after breakfast.    Yes [provider]  citalopram (CELEXA) 10 MG tablet TAKE 1 TABLET (10 MG TOTAL) BY MOUTH DAILY. Patient taking differently: Take 10 mg by mouth daily after  breakfast.  06/01/16  Yes York Spaniel, MD  donepezil (ARICEPT) 23 MG TABS tablet Take 1 tablet (23 mg total) by mouth at bedtime. 07/10/16  Yes York Spaniel, MD  memantine (NAMENDA) 10 MG tablet TAKE 1 TABLET (10 MG TOTAL) BY MOUTH 2 (TWO) TIMES DAILY. 02/23/16  Yes York Spaniel, MD  Multiple Vitamins-Minerals (MULTIVITAMIN PO) Take 1 tablet by mouth daily after breakfast.    Yes [provider]  Omega-3 Fatty Acids (FISH OIL PO) Take 1 tablet by mouth daily after breakfast.    Yes [provider]  simvastatin (ZOCOR) 20 MG tablet TAKE 1 TABLET BY MOUTH EVERY DAY Patient taking differently: TAKE 20 MG BY MOUTH EVERY EVENING 05/30/16  Yes Doreene Nest, NP    Allergies Niacin and related   REVIEW OF SYSTEMS  Negative except as noted here or in the History of Present Illness.   PHYSICAL EXAMINATION  Initial Vital Signs Blood pressure (!) 149/65, pulse (!) 52, temperature 98.8 F (37.1 C), temperature source Oral, resp. rate 18, height 5\' 3"  (1.6 m), weight 64.8 kg (142 lb 14.4 oz), SpO2 97 %.  Examination General: Well-developed, well-nourished female in no acute distress; appearance consistent with age of record HENT: normocephalic; atraumatic Eyes: pupils equal, round and  reactive to light; extraocular muscles intact Neck: supple Heart: regular rate and rhythm; no murmur; bradycardia Lungs: clear to auscultation bilaterally Chest: Nontender Abdomen: soft; nondistended; nontender; no masses or hepatosplenomegaly; bowel sounds present Extremities: No deformity; full range of motion; pulses normal Neurologic: Awake, alert; motor function intact in all extremities and symmetric; no facial droop Skin: Warm and dry Psychiatric: Normal mood and affect   RESULTS  Summary of this visit's results, reviewed by myself:  EKG Interpretation:  Date & Time: 08/18/2016 12:00 AM  Rate: 48  Rhythm: sinus bradycardia  QRS Axis: normal  Intervals: normal   ST/T Wave abnormalities: normal  Conduction Disutrbances:none  Narrative Interpretation:   Old EKG Reviewed: rate is slower  Laboratory Studies: Results for orders placed or performed during the hospital encounter of 08/17/16 (from the past 24 hour(s))  Urinalysis, Routine w reflex microscopic- may I&O cath if menses     Status: Abnormal   Collection Time: 08/17/16 10:06 PM  Result Value Ref Range   Color, Urine AMBER (A) YELLOW   APPearance HAZY (A) CLEAR   Specific Gravity, Urine 1.026 1.005 - 1.030   pH 5.0 5.0 - 8.0   Glucose, UA NEGATIVE NEGATIVE mg/dL   Hgb urine dipstick NEGATIVE NEGATIVE   Bilirubin Urine NEGATIVE NEGATIVE   Ketones, ur 20 (A) NEGATIVE mg/dL   Protein, ur NEGATIVE NEGATIVE mg/dL   Nitrite NEGATIVE NEGATIVE   Leukocytes, UA TRACE (A) NEGATIVE   RBC / HPF 0-5 0 - 5 RBC/hpf   WBC, UA 0-5 0 - 5 WBC/hpf   Bacteria, UA NONE SEEN NONE SEEN   Squamous Epithelial / LPF 0-5 (A) NONE SEEN   Mucous PRESENT    Hyaline Casts, UA PRESENT   Lipase, blood     Status: None   Collection Time: 08/17/16 11:38 PM  Result Value Ref Range   Lipase 25 11 - 51 U/L  Comprehensive metabolic panel     Status: Abnormal   Collection Time: 08/17/16 11:38 PM  Result Value Ref Range   Sodium 139 135 - 145 mmol/L   Potassium 4.6 3.5 - 5.1 mmol/L   Chloride 106 101 - 111 mmol/L   CO2 26 22 - 32 mmol/L   Glucose, Bld 91 65 - 99 mg/dL   BUN 16 6 - 20 mg/dL   Creatinine, Ser 1.610.80 0.44 - 1.00 mg/dL   Calcium 8.9 8.9 - 09.610.3 mg/dL   Total Protein 6.1 (L) 6.5 - 8.1 g/dL   Albumin 3.4 (L) 3.5 - 5.0 g/dL   AST 34 15 - 41 U/L   ALT 22 14 - 54 U/L   Alkaline Phosphatase 75 38 - 126 U/L   Total Bilirubin 0.8 0.3 - 1.2 mg/dL   GFR calc non Af Amer >60 >60 mL/min   GFR calc Af Amer >60 >60 mL/min   Anion gap 7 5 - 15  CBC     Status: None   Collection Time: 08/17/16 11:38 PM  Result Value Ref Range   WBC 6.1 4.0 - 10.5 K/uL   RBC 4.48 3.87 - 5.11 MIL/uL   Hemoglobin 14.1 12.0 - 15.0  g/dL   HCT 04.540.7 40.936.0 - 81.146.0 %   MCV 90.8 78.0 - 100.0 fL   MCH 31.5 26.0 - 34.0 pg   MCHC 34.6 30.0 - 36.0 g/dL   RDW 91.413.1 78.211.5 - 95.615.5 %   Platelets 179 150 - 400 K/uL  I-stat troponin, ED     Status: None   Collection Time:  08/17/16 11:58 PM  Result Value Ref Range   Troponin i, poc 0.00 0.00 - 0.08 ng/mL   Comment 3           Imaging Studies: Dg Chest 2 View  Result Date: 08/18/2016 CLINICAL DATA:  Acute onset of left flank pain and nausea. Initial encounter. EXAM: CHEST  2 VIEW COMPARISON:  Chest radiograph performed 05/21/2009 FINDINGS: The lungs are well-aerated and clear. There is no evidence of focal opacification, pleural effusion or pneumothorax. The heart is normal in size; the mediastinal contour is within normal limits. No acute osseous abnormalities are seen. IMPRESSION: No acute cardiopulmonary process seen. Electronically Signed   By: Roanna Raider M.D.   On: 08/18/2016 00:31    ED COURSE  Nursing notes and initial vitals signs, including pulse oximetry, reviewed.  Vitals:   08/18/16 0045 08/18/16 0100 08/18/16 0115 08/18/16 0130  BP:  (!) 164/73  128/66  Pulse: (!) 52 (!) 54 (!) 54 (!) 52  Resp:    17  Temp:      TempSrc:      SpO2: 99% 100% 96% 97%  Weight:      Height:       1:37 AM Significant improvement with GI cocktail. Suspect GERD. The patient is on Fosamax which can also cause esophagitis. Apart from sinus bradycardia her EKG is unchanged and her troponin is zero. We will place on a PPI and have her follow-up with her PCP.  PROCEDURES    ED DIAGNOSES     ICD-10-CM   1. Atypical chest pain R07.89        Paula Libra, MD 08/18/16 [phone]

## 2016-08-17 NOTE — ED Notes (Addendum)
Pt c/o nausea no vomiting, ringing in the ears, and 10/10left flank pain radiating to the abd, denies urinary problems.

## 2016-08-17 NOTE — ED Triage Notes (Signed)
Pt c/o L flank pain and nausea since Monday. She denies urinary symptoms, vomiting, diarrhea or hx of kidney stones. A&Px4. Ambulatory.

## 2016-08-18 ENCOUNTER — Emergency Department (HOSPITAL_COMMUNITY): Payer: Medicare PPO

## 2016-08-18 DIAGNOSIS — R0789 Other chest pain: Secondary | ICD-10-CM | POA: Diagnosis not present

## 2016-08-18 LAB — COMPREHENSIVE METABOLIC PANEL
ALBUMIN: 3.4 g/dL — AB (ref 3.5–5.0)
ALK PHOS: 75 U/L (ref 38–126)
ALT: 22 U/L (ref 14–54)
AST: 34 U/L (ref 15–41)
Anion gap: 7 (ref 5–15)
BUN: 16 mg/dL (ref 6–20)
CALCIUM: 8.9 mg/dL (ref 8.9–10.3)
CO2: 26 mmol/L (ref 22–32)
Chloride: 106 mmol/L (ref 101–111)
Creatinine, Ser: 0.8 mg/dL (ref 0.44–1.00)
GFR calc Af Amer: >60 mL/min (ref >60)
GFR calc non Af Amer: >60 mL/min (ref >60)
GLUCOSE: 91 mg/dL (ref 65–99)
POTASSIUM: 4.6 mmol/L (ref 3.5–5.1)
Sodium: 139 mmol/L (ref 135–145)
Total Bilirubin: 0.8 mg/dL (ref 0.3–1.2)
Total Protein: 6.1 g/dL — ABNORMAL LOW (ref 6.5–8.1)

## 2016-08-18 LAB — I-STAT TROPONIN, ED: Troponin i, poc: 0 ng/mL (ref 0.00–0.08)

## 2016-08-18 LAB — LIPASE, BLOOD: Lipase: 25 U/L (ref 11–51)

## 2016-08-18 MED ORDER — PANTOPRAZOLE SODIUM 20 MG PO TBEC: 20.0000 mg | DELAYED_RELEASE_TABLET | Freq: Every day | ORAL | 0 refills | Status: DC

## 2016-08-18 MED ORDER — PANTOPRAZOLE SODIUM 40 MG IV SOLR
40.0000 mg | Freq: Once | INTRAVENOUS | Status: AC
  Administered 2016-08-18: 40 mg via INTRAVENOUS
  Filled 2016-08-18: qty 40

## 2016-08-18 MED ORDER — GI COCKTAIL ~~LOC~~
30.0000 mL | Freq: Once | ORAL | Status: AC
  Administered 2016-08-18: 30 mL via ORAL
  Filled 2016-08-18: qty 30

## 2016-08-18 NOTE — ED Notes (Signed)
EKG given to EDP,Molpus,MD., for review. 

## 2016-08-25 ENCOUNTER — Other Ambulatory Visit: Payer: Self-pay | Admitting: Neurology

## 2016-09-08 ENCOUNTER — Other Ambulatory Visit: Payer: Self-pay | Admitting: Primary Care

## 2016-09-08 ENCOUNTER — Telehealth: Payer: Self-pay

## 2016-09-08 DIAGNOSIS — K219 Gastro-esophageal reflux disease without esophagitis: Secondary | ICD-10-CM

## 2016-09-08 MED ORDER — RANITIDINE HCL 150 MG PO TABS: ORAL_TABLET | ORAL | 0 refills | Status: DC

## 2016-09-08 NOTE — Telephone Encounter (Signed)
Message left for patient to return my call.  

## 2016-09-08 NOTE — Telephone Encounter (Signed)
Spoken to patient's daughter. Not sure how long she has been taking it. Over a week now without it and she has stomach pain and heart burn. Note if she tried zantac or pedcid

## 2016-09-08 NOTE — Telephone Encounter (Signed)
Please notify patient that long term use of medications like pantoprazole can cause problems like decrease in bone density. I'd like for her to try taking Zantac 150 mg once to twice daily for heartburn, I sent a prescription to her pharmacy. Start by taking 1 tablet once daily, she may take this twice daily if needed. Have her call me in 2 weeks if no improvement.

## 2016-09-08 NOTE — Telephone Encounter (Signed)
How long has she been on pantoprazole? What happens when she doesn't take it? Has she every tried Zantac or Pepcid for acid reflux? Please answer all questions.

## 2016-09-08 NOTE — Telephone Encounter (Signed)
Ok to refill? Protonix 20 mg

## 2016-09-08 NOTE — Telephone Encounter (Signed)
Rachel Garza DPR signed  Called back and name of med is pantoprazole; [facility] and pt was seen for annual in 02/04/16 but did not see where pantoprazole was discussed.Please advise. Please advise [facility] and Rachel Garza request cb when done.

## 2016-09-08 NOTE — Telephone Encounter (Signed)
Rachel Garza (DPR signed) left v/m requesting refill for a med prescribed by another doctor; did not leave name of med or pharmacy. Left v/m requesting cb with this information.

## 2016-09-11 NOTE — Telephone Encounter (Signed)
Message left for patient to return my call.  

## 2016-09-11 NOTE — Telephone Encounter (Signed)
Spoken and notified patient's daughter of Kate's comments. Patient's daughter verbalized understanding. 

## 2016-09-11 NOTE — Telephone Encounter (Signed)
cherri returned your call  Best number [phone]

## 2016-10-06 ENCOUNTER — Ambulatory Visit: Payer: Medicare PPO | Admitting: Internal Medicine

## 2016-10-06 ENCOUNTER — Ambulatory Visit (INDEPENDENT_AMBULATORY_CARE_PROVIDER_SITE_OTHER): Payer: Medicare PPO | Admitting: Family Medicine

## 2016-10-06 ENCOUNTER — Encounter: Payer: Self-pay | Admitting: Family Medicine

## 2016-10-06 VITALS — BP 98/60 | HR 59 | Temp 98.3°F | Resp 16 | Wt 145.5 lb

## 2016-10-06 DIAGNOSIS — R197 Diarrhea, unspecified: Secondary | ICD-10-CM | POA: Insufficient documentation

## 2016-10-06 LAB — CBC
HEMATOCRIT: 42.9 % (ref 36.0–46.0)
Hemoglobin: 13.9 g/dL (ref 12.0–15.0)
MCHC: 32.5 g/dL (ref 30.0–36.0)
MCV: 95.8 fl (ref 78.0–100.0)
Platelets: 200 10*3/uL (ref 150.0–400.0)
RBC: 4.48 Mil/uL (ref 3.87–5.11)
RDW: 14.4 % (ref 11.5–15.5)
WBC: 6.2 10*3/uL (ref 4.0–10.5)

## 2016-10-06 LAB — COMPREHENSIVE METABOLIC PANEL
ALBUMIN: 3.7 g/dL (ref 3.5–5.2)
ALK PHOS: 94 U/L (ref 39–117)
ALT: 21 U/L (ref 0–35)
AST: 29 U/L (ref 0–37)
BILIRUBIN TOTAL: 0.4 mg/dL (ref 0.2–1.2)
BUN: 15 mg/dL (ref 6–23)
CO2: 34 mEq/L — ABNORMAL HIGH (ref 19–32)
CREATININE: 0.96 mg/dL (ref 0.40–1.20)
Calcium: 9.4 mg/dL (ref 8.4–10.5)
Chloride: 104 mEq/L (ref 96–112)
GFR: 60.25 mL/min (ref >60.00)
Glucose, Bld: 86 mg/dL (ref 70–99)
POTASSIUM: 4.8 meq/L (ref 3.5–5.1)
SODIUM: 140 meq/L (ref 135–145)
TOTAL PROTEIN: 6.4 g/dL (ref 6.0–8.3)

## 2016-10-06 NOTE — Assessment & Plan Note (Signed)
New acute uncomplicated illness. Likely viral in origin especially given recent sick contacts. Reassuring exam. Laboratory studies today to ensure no electrolyte disturbance/dehydration. Supportive care.

## 2016-10-06 NOTE — Progress Notes (Signed)
Subjective:  Patient ID: Rachel LevyMartha A Garza, female    DOB: 02/12/1941  Age: 76 y.o. MRN: [id number]  CC: Diarrhea  HPI:  76 year old female with mild cognitive impairment presents with diarrhea. History obtained primarily from the daughter given cognitive impairment.  Patient has had diarrhea for the past 2 days. He's had some associated chills. No fever. She also states she's had some nasal congestion. No known exacerbating or relieving factors. No interventions or medications tried. Daughter states that she and her husband have had a similar illness. She has no abdominal pain currently. She has had some lower abdominal discomfort recently. Patient states her diarrhea is now resolved. She has essentially no complaints.   Social Hx   Social History   Social History  . Marital status: Married    Spouse name: N/A  . Number of children: 3  . Years of education: 1911   Occupational History  . Retired    Social History Main Topics  . Smoking status: Never Smoker  . Smokeless tobacco: Never Used  . Alcohol use No  . Drug use: No  . Sexual activity: Not Asked   Other Topics Concern  . None   Social History Narrative   Married.   3 children. 1 grandchild.   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   Enjoys gardening, spending time with family.   Right-handed   Caffeine: 2-3 cups coffee per day    Review of Systems  Constitutional: Positive for chills. Negative for fever.  Gastrointestinal: Positive for diarrhea.   Objective:  BP 98/60 (BP Location: Left Arm, Patient Position: Sitting, Cuff Size: Normal)   Pulse (!) 59   Temp 98.3 F (36.8 C) (Oral)   Resp 16   Wt 145 lb 8 oz (66 kg)   SpO2 97%   BMI 25.77 kg/m   BP/Weight 10/06/2016 08/18/2016 08/17/2016  Systolic BP 98 128 -  Diastolic BP 60 66 -  Wt. (Lbs) 145.5 - 142.9  BMI 25.77 - 25.31    Physical Exam  Constitutional: She appears well-developed. No distress.  HENT:  Head: Normocephalic and atraumatic.    Mouth/Throat: Oropharynx is clear and moist.  Cardiovascular: Regular rhythm.   Bradycardia.  Pulmonary/Chest: Effort normal. She has no wheezes. She has no rales.  Abdominal: Soft. She exhibits no distension. There is no tenderness. There is no rebound and no guarding.  Neurological: She is alert.  Psychiatric: She has a normal mood and affect.  Vitals reviewed.   Lab Results  Component Value Date   WBC 6.1 08/17/2016   HGB 14.1 08/17/2016   HCT 40.7 08/17/2016   PLT 179 08/17/2016   GLUCOSE 91 08/17/2016   CHOL 170 05/16/2016   TRIG 50.0 05/16/2016   HDL 73.50 05/16/2016   LDLCALC 87 05/16/2016   ALT 22 08/17/2016   AST 34 08/17/2016   NA 139 08/17/2016   K 4.6 08/17/2016   CL 106 08/17/2016   CREATININE 0.80 08/17/2016   BUN 16 08/17/2016   CO2 26 08/17/2016   TSH 2.080 12/31/2015   INR 0.96 05/21/2009    Assessment & Plan:   Problem List Items Addressed This Visit    Diarrhea - Primary    New acute uncomplicated illness. Likely viral in origin especially given recent sick contacts. Reassuring exam. Laboratory studies today to ensure no electrolyte disturbance/dehydration. Supportive care.      Relevant Orders   CBC   Comprehensive metabolic panel     Follow-up: PRN  Ladamien Rammel  [place]

## 2016-10-06 NOTE — Patient Instructions (Signed)
Likely viral.  We will call with your lab work.  Take care  Dr. Adriana Simasook

## 2016-11-01 ENCOUNTER — Ambulatory Visit (INDEPENDENT_AMBULATORY_CARE_PROVIDER_SITE_OTHER): Payer: Medicare PPO | Admitting: Primary Care

## 2016-11-01 ENCOUNTER — Encounter: Payer: Self-pay | Admitting: Primary Care

## 2016-11-01 VITALS — BP 114/70 | HR 54 | Temp 97.8°F | Ht 64.0 in | Wt 144.8 lb

## 2016-11-01 DIAGNOSIS — R1030 Lower abdominal pain, unspecified: Secondary | ICD-10-CM

## 2016-11-01 LAB — POC URINALSYSI DIPSTICK (AUTOMATED)
Bilirubin, UA: NEGATIVE
GLUCOSE UA: NEGATIVE
Ketones, UA: NEGATIVE
Leukocytes, UA: NEGATIVE
Nitrite, UA: NEGATIVE
Protein, UA: NEGATIVE
RBC UA: NEGATIVE
Urobilinogen, UA: 0.2 E.U./dL
pH, UA: 6 (ref 5.0–8.0)

## 2016-11-01 LAB — CBC WITH DIFFERENTIAL/PLATELET
BASOS PCT: 0.6 % (ref 0.0–3.0)
Basophils Absolute: 0 10*3/uL (ref 0.0–0.1)
Eosinophils Absolute: 0.2 10*3/uL (ref 0.0–0.7)
Eosinophils Relative: 4.8 % (ref 0.0–5.0)
HCT: 41.3 % (ref 36.0–46.0)
Hemoglobin: 13.2 g/dL (ref 12.0–15.0)
LYMPHS ABS: 1.4 10*3/uL (ref 0.7–4.0)
Lymphocytes Relative: 28 % (ref 12.0–46.0)
MCHC: 32 g/dL (ref 30.0–36.0)
MCV: 96.8 fl (ref 78.0–100.0)
MONO ABS: 0.4 10*3/uL (ref 0.1–1.0)
Monocytes Relative: 9.3 % (ref 3.0–12.0)
NEUTROS ABS: 2.8 10*3/uL (ref 1.4–7.7)
NEUTROS PCT: 57.3 % (ref 43.0–77.0)
Platelets: 210 10*3/uL (ref 150.0–400.0)
RBC: 4.27 Mil/uL (ref 3.87–5.11)
RDW: 14 % (ref 11.5–15.5)
WBC: 4.8 10*3/uL (ref 4.0–10.5)

## 2016-11-01 LAB — BASIC METABOLIC PANEL
BUN: 16 mg/dL (ref 6–23)
CALCIUM: 9.1 mg/dL (ref 8.4–10.5)
CHLORIDE: 104 meq/L (ref 96–112)
CO2: 31 meq/L (ref 19–32)
CREATININE: 0.83 mg/dL (ref 0.40–1.20)
GFR: 71.25 mL/min (ref >60.00)
Glucose, Bld: 75 mg/dL (ref 70–99)
Potassium: 4.1 mEq/L (ref 3.5–5.1)
Sodium: 139 mEq/L (ref 135–145)

## 2016-11-01 NOTE — Patient Instructions (Signed)
Your urine is negative for infection.  Complete lab work prior to leaving today. I will notify you of your results once received.   It was a pleasure to see you today!

## 2016-11-01 NOTE — Addendum Note (Signed)
Addended by: Tawnya CrookSAMBATH, Jaymond Waage on: 11/01/2016 11:32 AM   Modules accepted: Orders

## 2016-11-01 NOTE — Progress Notes (Signed)
Subjective:    Patient ID: Rachel LevyMartha A Garza, female    DOB: 04/23/1940, 76 y.o.   MRN: [id number]  HPI  Rachel Garza is a 76 year old female with a history of hysterectomy, umbilical hernia repair who presents today with a chief complaint of abdominal pain. She is here with her daughter who is helping with the HPI given her history of cognitive impairment.   Her pain is located to the bilateral lower abdomen that began two weeks ago. Her pain is intermittent and feels like a "ache/[pressure". Her last bowel movement was yesterday. She denies constipation, fevers, urinary frequency, hematuria, vomiting. She's able to eat and drink as usual.   [facility] with a two day history of diarrhea. Her symptoms were presumed to be viral in nature, labs returned unremarkable.   Review of Systems  Constitutional: Negative for appetite change, fatigue and fever.  Gastrointestinal: Positive for abdominal pain. Negative for blood in stool, constipation, diarrhea, nausea and vomiting.  Genitourinary: Negative for dysuria, flank pain, frequency, hematuria and vaginal discharge.       Past Medical History:  Diagnosis Date  . Dementia   . Hepatitis   . Hyperlipidemia   . Osteopenia      Social History   Social History  . Marital status: Married    Spouse name: N/A  . Number of children: 3  . Years of education: 2511   Occupational History  . Retired    Social History Main Topics  . Smoking status: Never Smoker  . Smokeless tobacco: Never Used  . Alcohol use No  . Drug use: No  . Sexual activity: Not on file   Other Topics Concern  . Not on file   Social History Narrative   Married.   3 children. 1 grandchild.   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   Enjoys gardening, spending time with family.   Right-handed   Caffeine: 2-3 cups coffee per day    Past Surgical History:  Procedure Laterality Date  . UMBILICAL HERNIA REPAIR    . VAGINAL  HYSTERECTOMY      Family History  Problem Relation Age of Onset  . Coronary artery disease Mother   . Coronary artery disease Father   . Diabetes Mellitus I Sister   . Cancer Sister   . Glaucoma Sister   . Breast cancer Maternal Aunt     Allergies  Allergen Reactions  . Niacin And Related     Current Outpatient Prescriptions on File Prior to Visit  Medication Sig Dispense Refill  . alendronate (FOSAMAX) 70 MG tablet Take 1 tablet by mouth once every 7 days. Take with a full glass of water on an empty stomach. 12 tablet 3  . aspirin 81 MG tablet Take 81 mg by mouth daily after breakfast.     . BIOTIN 5000 PO Take 5,000 mcg by mouth daily after breakfast.     . citalopram (CELEXA) 10 MG tablet TAKE 1 TABLET (10 MG TOTAL) BY MOUTH DAILY. (Patient taking differently: Take 10 mg by mouth daily after breakfast. ) 90 tablet 1  . donepezil (ARICEPT) 23 MG TABS tablet Take 1 tablet (23 mg total) by mouth at bedtime. 30 tablet 5  . memantine (NAMENDA) 10 MG tablet TAKE 1 TABLET (10 MG TOTAL) BY MOUTH 2 (TWO) TIMES DAILY. 60 tablet 5  . Multiple Vitamins-Minerals (MULTIVITAMIN PO) Take 1 tablet by mouth daily after breakfast.     . Omega-3  Fatty Acids (FISH OIL PO) Take 1 tablet by mouth daily after breakfast.     . ranitidine (ZANTAC) 150 MG tablet Take 1 tablet by mouth once to twice daily for heartburn. 180 tablet 0  . simvastatin (ZOCOR) 20 MG tablet TAKE 1 TABLET BY MOUTH EVERY DAY (Patient taking differently: TAKE 20 MG BY MOUTH EVERY EVENING) 30 tablet 5   No current facility-administered medications on file prior to visit.     BP 114/70   Pulse (!) 54   Temp 97.8 F (36.6 C) (Oral)   Ht 5\' 4"  (1.626 m)   Wt 144 lb 12.8 oz (65.7 kg)   SpO2 94%   BMI 24.85 kg/m    Objective:   Physical Exam  Constitutional: She appears well-nourished.  Neck: Neck supple.  Cardiovascular: Normal rate and regular rhythm.   Pulmonary/Chest: Effort normal and breath sounds normal.    Abdominal: Soft. Normal appearance and bowel sounds are normal. There is no tenderness. There is no CVA tenderness.  Skin: Skin is warm and dry.          Assessment & Plan:  Abdominal Pain:  Located to bilateral lower abdomen x 2 weeks. Non tender on exam, unremarkable. Does not appear sickly/ill. UA today: Negative. Check CBC and BMP today.  Consider imaging if symptoms persist and labs inconclusive.  [name], NP

## 2016-11-14 ENCOUNTER — Encounter: Payer: Self-pay | Admitting: Primary Care

## 2016-11-14 ENCOUNTER — Ambulatory Visit (INDEPENDENT_AMBULATORY_CARE_PROVIDER_SITE_OTHER): Payer: Medicare PPO | Admitting: Primary Care

## 2016-11-14 VITALS — BP 114/72 | HR 57 | Temp 97.6°F | Ht 64.0 in | Wt 148.8 lb

## 2016-11-14 DIAGNOSIS — B029 Zoster without complications: Secondary | ICD-10-CM

## 2016-11-14 MED ORDER — VALACYCLOVIR HCL 1 G PO TABS: 1000.0000 mg | ORAL_TABLET | Freq: Three times a day (TID) | ORAL | 0 refills | Status: DC

## 2016-11-14 NOTE — Patient Instructions (Signed)
Start valacyclovir 1000 mg tablets for shingles. Take 1 tablet by mouth three times daily for 7 days.  Please protect any vesicles (bubbles) to prevent them from opening. If you develop vesicles that open the you are considered contagious.   You may take tylenol or ibuprofen as needed for never pain.   It was a pleasure to see you today!   Shingles Shingles, which is also known as herpes zoster, is an infection that causes a painful skin rash and fluid-filled blisters. Shingles is not related to genital herpes, which is a sexually transmitted infection. Shingles only develops in people who:  Have had chickenpox.  Have received the chickenpox vaccine. (This is rare.)  What are the causes? Shingles is caused by varicella-zoster virus (VZV). This is the same virus that causes chickenpox. After exposure to VZV, the virus stays in the body in an inactive (dormant) state. Shingles develops if the virus reactivates. This can happen many years after the initial exposure to VZV. It is not known what causes this virus to reactivate. What increases the risk? People who have had chickenpox or received the chickenpox vaccine are at risk for shingles. Infection is more common in people who:  Are older than age 66.  Have a weakened defense (immune) system, such as those with HIV, AIDS, or cancer.  Are taking medicines that weaken the immune system, such as transplant medicines.  Are under great stress.  What are the signs or symptoms? Early symptoms of this condition include itching, tingling, and pain in an area on your skin. Pain may be described as burning, stabbing, or throbbing. A few days or weeks after symptoms start, a painful red rash appears, usually on one side of the body in a bandlike or beltlike pattern. The rash eventually turns into fluid-filled blisters that break open, scab over, and dry up in about 2-3 weeks. At any time during the infection, you may also develop:  A  fever.  Chills.  A headache.  An upset stomach.  How is this diagnosed? This condition is diagnosed with a skin exam. Sometimes, skin or fluid samples are taken from the blisters before a diagnosis is made. These samples are examined under a microscope or sent to a lab for testing. How is this treated? There is no specific cure for this condition. Your health care provider will probably prescribe medicines to help you manage pain, recover more quickly, and avoid long-term problems. Medicines may include:  Antiviral drugs.  Anti-inflammatory drugs.  Pain medicines.  If the area involved is on your face, you may be referred to a specialist, such as an eye doctor (ophthalmologist) or an ear, nose, and throat (ENT) doctor to help you avoid eye problems, chronic pain, or disability. Follow these instructions at home: Medicines  Take medicines only as directed by your health care provider.  Apply an anti-itch or numbing cream to the affected area as directed by your health care provider. Blister and Rash Care  Take a cool bath or apply cool compresses to the area of the rash or blisters as directed by your health care provider. This may help with pain and itching.  Keep your rash covered with a loose bandage (dressing). Wear loose-fitting clothing to help ease the pain of material rubbing against the rash.  Keep your rash and blisters clean with mild soap and cool water or as directed by your health care provider.  Check your rash every day for signs of infection. These include redness, swelling,  and pain that lasts or increases.  Do not pick your blisters.  Do not scratch your rash. General instructions  Rest as directed by your health care provider.  Keep all follow-up visits as directed by your health care provider. This is important.  Until your blisters scab over, your infection can cause chickenpox in people who have never had it or been vaccinated against it. To prevent  this from happening, avoid contact with other people, especially: ? Babies. ? Pregnant women. ? Children who have eczema. ? Elderly people who have transplants. ? People who have chronic illnesses, such as leukemia or AIDS. Contact a health care provider if:  Your pain is not relieved with prescribed medicines.  Your pain does not get better after the rash heals.  Your rash looks infected. Signs of infection include redness, swelling, and pain that lasts or increases. Get help right away if:  The rash is on your face or nose.  You have facial pain, pain around your eye area, or loss of feeling on one side of your face.  You have ear pain or you have ringing in your ear.  You have loss of taste.  Your condition gets worse. This information is not intended to replace advice given to you by your health care provider. Make sure you discuss any questions you have with your health care provider. Document Released: 02/13/2005 Document Revised: 10/10/2015 Document Reviewed: 12/25/2013 Elsevier Interactive Patient Education  2017 [employer].

## 2016-11-14 NOTE — Progress Notes (Signed)
Subjective:    Patient ID: Rachel Garza, female    DOB: Sep 19, 1940, 76 y.o.   MRN: [id number]  HPI  Rachel Garza is a 76 year old female who presents today with a chief complaint of rash. Her rash is located to the left side of her anterior chest under the breasts and spreads around to the left lateral and posterior trunk. She first noticed symptoms of a discomfort to the skin 2-3 days ago. Yesterday she noticed a rash appear to the site of the skin discomfort which has since progressed. She denies itching, fevers, tick bites, being outdoors. No one else in her household is with a rash. She denies history of shingles.  Review of Systems  Constitutional: Negative for fever.  Skin: Positive for rash.  Neurological: Negative for numbness.       Past Medical History:  Diagnosis Date  . Dementia   . Hepatitis   . Hyperlipidemia   . Osteopenia      Social History   Social History  . Marital status: Married    Spouse name: N/A  . Number of children: 3  . Years of education: 75   Occupational History  . Retired    Social History Main Topics  . Smoking status: Never Smoker  . Smokeless tobacco: Never Used  . Alcohol use No  . Drug use: No  . Sexual activity: Not on file   Other Topics Concern  . Not on file   Social History Narrative   Married.   3 children. 1 grandchild.   Retired. Once worked as a Youth worker.   Enjoys gardening, spending time with family.   Right-handed   Caffeine: 2-3 cups coffee per day    Past Surgical History:  Procedure Laterality Date  . UMBILICAL HERNIA REPAIR    . VAGINAL HYSTERECTOMY      Family History  Problem Relation Age of Onset  . Coronary artery disease Mother   . Coronary artery disease Father   . Diabetes Mellitus I Sister   . Cancer Sister   . Glaucoma Sister   . Breast cancer Maternal Aunt     Allergies  Allergen Reactions  . Niacin And Related     Current Outpatient Prescriptions on File Prior to Visit    Medication Sig Dispense Refill  . alendronate (FOSAMAX) 70 MG tablet Take 1 tablet by mouth once every 7 days. Take with a full glass of water on an empty stomach. 12 tablet 3  . aspirin 81 MG tablet Take 81 mg by mouth daily after breakfast.     . BIOTIN 5000 PO Take 5,000 mcg by mouth daily after breakfast.     . citalopram (CELEXA) 10 MG tablet TAKE 1 TABLET (10 MG TOTAL) BY MOUTH DAILY. (Patient taking differently: Take 10 mg by mouth daily after breakfast. ) 90 tablet 1  . donepezil (ARICEPT) 23 MG TABS tablet Take 1 tablet (23 mg total) by mouth at bedtime. 30 tablet 5  . memantine (NAMENDA) 10 MG tablet TAKE 1 TABLET (10 MG TOTAL) BY MOUTH 2 (TWO) TIMES DAILY. 60 tablet 5  . Multiple Vitamins-Minerals (MULTIVITAMIN PO) Take 1 tablet by mouth daily after breakfast.     . Omega-3 Fatty Acids (FISH OIL PO) Take 1 tablet by mouth daily after breakfast.     . ranitidine (ZANTAC) 150 MG tablet Take 1 tablet by mouth once to twice daily for heartburn. 180 tablet 0  . simvastatin (ZOCOR) 20 MG tablet TAKE  1 TABLET BY MOUTH EVERY DAY (Patient taking differently: TAKE 20 MG BY MOUTH EVERY EVENING) 30 tablet 5   No current facility-administered medications on file prior to visit.     BP 114/72   Pulse (!) 57   Temp 97.6 F (36.4 C) (Oral)   Ht  (1.626 m)   Wt 148 lb 12.8 oz (67.5 kg)   SpO2 97%   BMI 25.54 kg/m    Objective:   Physical Exam  Constitutional: She appears well-nourished.  Cardiovascular: Normal rate and regular rhythm.   Pulmonary/Chest: Effort normal and breath sounds normal.    Skin: Skin is warm and dry.     Linear, vesicle-type rash under left breast which wraps around her left lateral and posterior trunk. No open vesicles. Non tender. Moderate erythema.          Assessment & Plan:  Herpes Zoster:  Skin discomfort and rash to left anterior, lateral, and posterior trunk x 2-3 days. Exam today consistent for Herpes Zoster. No open vesicles. Rx for  Valtrex 1 g TID x 7 days sent to pharmacy. Discussed to use Ibuprofen PRN discomfort, she will update if pain increases. Discussed care of rash and how to prevent spread if vesicles were to open. Will provide Shingrix vaccination in 1 month after symptoms have completely resolved.   [name], NP

## 2016-11-20 ENCOUNTER — Other Ambulatory Visit: Payer: Self-pay | Admitting: Primary Care

## 2016-11-20 DIAGNOSIS — B029 Zoster without complications: Secondary | ICD-10-CM

## 2016-11-22 ENCOUNTER — Other Ambulatory Visit: Payer: Self-pay

## 2016-11-22 DIAGNOSIS — B029 Zoster without complications: Secondary | ICD-10-CM

## 2016-11-22 NOTE — Telephone Encounter (Signed)
Cherri (DPR signed) said that pt will be finishing valtrex in one day and shingles are not draining now but area in crease of breast are staying moist; still having pain with shingles and Cherri wonders if should have refill for valtrex. Pt taking aleve for pain.[facility]. Pt seen 11/14/16. Cherri request cb and understands it could be tomorrow when gets cb.Please advise.

## 2016-11-23 MED ORDER — TRAMADOL HCL 50 MG PO TABS: 50.0000 mg | ORAL_TABLET | Freq: Three times a day (TID) | ORAL | 0 refills | Status: DC | PRN

## 2016-11-23 NOTE — Telephone Encounter (Signed)
I spoke to [facility] and she is aware as instructed and is okay with calling in the Rx for Tramadol.... Rx called into [facility]

## 2016-11-23 NOTE — Telephone Encounter (Signed)
Would not recommend refill of valtrex until seen by someone tomorrow.  Please call in Tramadol 50 mg tablets. Take 1 tablet by mouth every 8 hours as needed for pain. #30, no refills.

## 2016-11-23 NOTE — Telephone Encounter (Signed)
Rachel Garza (DPR signed) said pt has taken the last valtrex today and Rachel Garza wants to know if should have valtrex refilled and also today pt is complaining more with pain and aleve is not helping the pain and request med for pain also. Pt has alzheimers but does not understand why she is hurting so badly. Pain level now is 10. Pt has appt to see Dr Alphonsus Sias on 11/24/16 at 9:30. Rachel Garza request cb today to see if can get med for pain and possibly valtrex sent to [facility] .

## 2016-11-24 ENCOUNTER — Ambulatory Visit (INDEPENDENT_AMBULATORY_CARE_PROVIDER_SITE_OTHER): Payer: Medicare PPO | Admitting: Internal Medicine

## 2016-11-24 ENCOUNTER — Encounter: Payer: Self-pay | Admitting: Internal Medicine

## 2016-11-24 VITALS — BP 122/74 | HR 47 | Temp 97.3°F | Wt 144.0 lb

## 2016-11-24 DIAGNOSIS — B019 Varicella without complication: Secondary | ICD-10-CM | POA: Diagnosis not present

## 2016-11-24 DIAGNOSIS — B029 Zoster without complications: Secondary | ICD-10-CM | POA: Insufficient documentation

## 2016-11-24 MED ORDER — VALACYCLOVIR HCL 1 G PO TABS: 1000.0000 mg | ORAL_TABLET | Freq: Three times a day (TID) | ORAL | 0 refills | Status: DC

## 2016-11-24 NOTE — Assessment & Plan Note (Signed)
Still with some active lesions--but looks like it is close to crusting No signs of secondary bacterial infection Will extend the valacyclovir Tramadol/aleve for the pain

## 2016-11-24 NOTE — Patient Instructions (Signed)
Please call if you have ongoing pain after the shingles has healed---we would try a nerve pain reliever.

## 2016-11-24 NOTE — Progress Notes (Signed)
Subjective:    Patient ID: Rachel Garza, female    DOB: [birth year], 76 y.o.   MRN: [id number]  HPI Here due to ongoing problems with the shingles With daughter Wenda Low the valtrex for a week--- now took the last dose yesterday Back and side seem to be healing Has a couple of lesions under breast that are healing  Pain is better with the tramadol Aleve has helped some  Current Outpatient Prescriptions on File Prior to Visit  Medication Sig Dispense Refill  . alendronate (FOSAMAX) 70 MG tablet Take 1 tablet by mouth once every 7 days. Take with a full glass of water on an empty stomach. 12 tablet 3  . aspirin 81 MG tablet Take 81 mg by mouth daily after breakfast.     . BIOTIN 5000 PO Take 5,000 mcg by mouth daily after breakfast.     . citalopram (CELEXA) 10 MG tablet TAKE 1 TABLET (10 MG TOTAL) BY MOUTH DAILY. (Patient taking differently: Take 10 mg by mouth daily after breakfast. ) 90 tablet 1  . donepezil (ARICEPT) 23 MG TABS tablet Take 1 tablet (23 mg total) by mouth at bedtime. 30 tablet 5  . memantine (NAMENDA) 10 MG tablet TAKE 1 TABLET (10 MG TOTAL) BY MOUTH 2 (TWO) TIMES DAILY. 60 tablet 5  . Multiple Vitamins-Minerals (MULTIVITAMIN PO) Take 1 tablet by mouth daily after breakfast.     . Omega-3 Fatty Acids (FISH OIL PO) Take 1 tablet by mouth daily after breakfast.     . ranitidine (ZANTAC) 150 MG tablet Take 1 tablet by mouth once to twice daily for heartburn. 180 tablet 0  . simvastatin (ZOCOR) 20 MG tablet TAKE 1 TABLET BY MOUTH EVERY DAY (Patient taking differently: TAKE 20 MG BY MOUTH EVERY EVENING) 30 tablet 5  . traMADol (ULTRAM) 50 MG tablet Take 1 tablet (50 mg total) by mouth every 8 (eight) hours as needed. 30 tablet 0  . valACYclovir (VALTREX) 1000 MG tablet Take 1 tablet (1,000 mg total) by mouth 3 (three) times daily. 21 tablet 0   No current facility-administered medications on file prior to visit.     Allergies  Allergen Reactions  . Niacin And  Related     Past Medical History:  Diagnosis Date  . Dementia   . Hepatitis   . Hyperlipidemia   . Osteopenia     Past Surgical History:  Procedure Laterality Date  . UMBILICAL HERNIA REPAIR    . VAGINAL HYSTERECTOMY      Family History  Problem Relation Age of Onset  . Coronary artery disease Mother   . Coronary artery disease Father   . Diabetes Mellitus I Sister   . Cancer Sister   . Glaucoma Sister   . Breast cancer Maternal Aunt     Social History   Social History  . Marital status: Married    Spouse name: N/A  . Number of children: 3  . Years of education: 73   Occupational History  . Retired    Social History Main Topics  . Smoking status: Never Smoker  . Smokeless tobacco: Never Used  . Alcohol use No  . Drug use: No  . Sexual activity: Not on file   Other Topics Concern  . Not on file   Social History Narrative   Married.   3 children. 1 grandchild.   Retired. Once worked as a Youth worker.   Enjoys gardening, spending time with family.   Right-handed  Caffeine: 2-3 cups coffee per day   Review of Systems No fever No other lesions Appetite is okay Sleeping okay    Objective:   Physical Exam  Skin:  Crusted lesions on the back Multiple still open areas under left breast--but no new vesicular lesions          Assessment & Plan:

## 2016-11-28 ENCOUNTER — Other Ambulatory Visit: Payer: Self-pay | Admitting: Primary Care

## 2016-11-29 ENCOUNTER — Other Ambulatory Visit: Payer: Self-pay | Admitting: *Deleted

## 2016-11-29 MED ORDER — DONEPEZIL HCL 23 MG PO TABS: 23.0000 mg | ORAL_TABLET | Freq: Every day | ORAL | 0 refills | Status: DC

## 2016-11-30 ENCOUNTER — Telehealth: Payer: Self-pay | Admitting: *Deleted

## 2016-11-30 MED ORDER — MEMANTINE HCL 10 MG PO TABS: 10.0000 mg | ORAL_TABLET | Freq: Two times a day (BID) | ORAL | 1 refills | Status: DC

## 2016-11-30 NOTE — Telephone Encounter (Signed)
30 day rx. changed to 90 day rx. per pharmacy request/fim

## 2016-12-07 ENCOUNTER — Other Ambulatory Visit: Payer: Self-pay | Admitting: Primary Care

## 2016-12-07 DIAGNOSIS — K219 Gastro-esophageal reflux disease without esophagitis: Secondary | ICD-10-CM

## 2016-12-17 ENCOUNTER — Other Ambulatory Visit: Payer: Self-pay | Admitting: Primary Care

## 2016-12-17 DIAGNOSIS — M810 Age-related osteoporosis without current pathological fracture: Secondary | ICD-10-CM

## 2016-12-18 NOTE — Telephone Encounter (Signed)
Ok to refill? Electronically refill request for alendronate (FOSAMAX) 70 MG tablet  Last prescribed on 04/26/2016. Last seen on 11/24/2016

## 2016-12-19 NOTE — Telephone Encounter (Signed)
Noted refills sent to pharmacy. 

## 2017-01-04 ENCOUNTER — Telehealth: Payer: Self-pay | Admitting: Neurology

## 2017-01-04 NOTE — Telephone Encounter (Signed)
The patient will be seen by NP next week, but the family would prefer that I see the patient from now on.  The patient is having more irritability, may need to go up on the Celexa dosing, she is only on 10 mg daily currently.

## 2017-01-04 NOTE — Telephone Encounter (Signed)
[place] daughter ([place] has called stating that from last appointment when pt saw Dr Anne HahnWillis they(the family) were under the impression that the next appointment (this one for 11-12) would be with Dr Anne HahnWillis, pt daughter was not aware it was with [facility].  Pt daughter very much wants her mother to be seen on Monday by someone but is very upset over finding out that it is not with Dr Anne HahnWillis.  Pt daughter is asking to be called re: if there is anyway possible for the pt to be seen by Dr Anne HahnWillis.  Please call

## 2017-01-08 ENCOUNTER — Encounter (INDEPENDENT_AMBULATORY_CARE_PROVIDER_SITE_OTHER): Payer: Self-pay

## 2017-01-08 ENCOUNTER — Encounter: Payer: Self-pay | Admitting: Adult Health

## 2017-01-08 ENCOUNTER — Ambulatory Visit (INDEPENDENT_AMBULATORY_CARE_PROVIDER_SITE_OTHER): Payer: Medicare PPO | Admitting: Adult Health

## 2017-01-08 VITALS — BP 135/66 | HR 47 | Wt 150.0 lb

## 2017-01-08 DIAGNOSIS — F329 Major depressive disorder, single episode, unspecified: Secondary | ICD-10-CM

## 2017-01-08 DIAGNOSIS — R413 Other amnesia: Secondary | ICD-10-CM | POA: Diagnosis not present

## 2017-01-08 DIAGNOSIS — F32A Depression, unspecified: Secondary | ICD-10-CM

## 2017-01-08 MED ORDER — DONEPEZIL HCL 23 MG PO TABS: 23.0000 mg | ORAL_TABLET | Freq: Every day | ORAL | 3 refills | Status: DC

## 2017-01-08 MED ORDER — CITALOPRAM HYDROBROMIDE 20 MG PO TABS: 20.0000 mg | ORAL_TABLET | Freq: Every day | ORAL | 11 refills | Status: DC

## 2017-01-08 MED ORDER — MEMANTINE HCL 10 MG PO TABS: 10.0000 mg | ORAL_TABLET | Freq: Two times a day (BID) | ORAL | 3 refills | Status: DC

## 2017-01-08 NOTE — Progress Notes (Signed)
PATIENT: Rachel Garza DOB: 01/07/1941  REASON FOR VISIT: follow up-memory disturbance HISTORY FROM: patient-2 daughters  HISTORY OF PRESENT ILLNESS: Today 01/08/17  [name] is a 76 year old female with a history of progressive memory disturbance.  She returns today for follow-up.  She reports that she lives at home with her husband.  She feels that her memory has remained the same but daughters feel that it is worse.  she does require some prompting with ADLs.  She is currently not operating a motor vehicle.  The daughters report that occasionally their dad will let her drive if he is with her.  They have noted more agitation before bedtime.  The daughters also report to the nurse that she is having more crying spells.  She remains on Celexa 10 mg daily. the patient is also on Aricept and Namenda.  She had blood work and urinalysis through her primary care in September that was unremarkable.  The daughters feel that there is been no sudden change in her memory but rather gradual since the last office visit.  She returns today for an evaluation.  HISTORY [name] is a 76 year old right-handed white female with a history of a progressive memory disturbance. The patient lives at home with her husband, she gets help with keeping up with medications and appointments, she does not cook at this time, apparently she is still operating a motor vehicle, but her husband rides with her to ensure that she does not get lost. The patient does some yard work and housework. The husband does the finances. The patient indicates that she sleeps well at night, she has a fairly good appetite, she is not losing weight. She was placed on Namenda on her last visit, she is tolerating the medication well. The family indicates that she continues to progress with her memory.  REVIEW OF SYSTEMS: Out of a complete 14 system review of symptoms, the patient complains only of the following symptoms, and all other reviewed  systems are negative.  Agitation, behavior problems, confusion, depression, nervous/anxious, hallucinations, suicidal thoughts, sleep talking, leg swelling  ALLERGIES: Allergies  Allergen Reactions  . Niacin And Related     HOME MEDICATIONS: Outpatient Medications Prior to Visit  Medication Sig Dispense Refill  . alendronate (FOSAMAX) 70 MG tablet TAKE 1 TABLET BY MOUTH ONCE EVERY 7 DAYS. TAKE WITH A FULL GLASS OF WATER ON AN EMPTY STOMACH. 12 tablet 3  . aspirin 81 MG tablet Take 81 mg by mouth daily after breakfast.     . BIOTIN 5000 PO Take 5,000 mcg by mouth daily after breakfast.     . citalopram (CELEXA) 10 MG tablet TAKE 1 TABLET (10 MG TOTAL) BY MOUTH DAILY. (Patient taking differently: Take 10 mg by mouth daily after breakfast. ) 90 tablet 1  . donepezil (ARICEPT) 23 MG TABS tablet Take 1 tablet (23 mg total) by mouth at bedtime. 90 tablet 0  . memantine (NAMENDA) 10 MG tablet Take 1 tablet (10 mg total) by mouth 2 (two) times daily. 180 tablet 1  . Multiple Vitamins-Minerals (MULTIVITAMIN PO) Take 1 tablet by mouth daily after breakfast.     . Omega-3 Fatty Acids (FISH OIL PO) Take 1 tablet by mouth daily after breakfast.     . ranitidine (ZANTAC) 150 MG tablet TAKE 1 TABLET BY MOUTH ONCE TO TWICE DAILY FOR HEARTBURN. 180 tablet 1  . simvastatin (ZOCOR) 20 MG tablet TAKE 1 TABLET BY MOUTH EVERY DAY 30 tablet 5  . traMADol (  ULTRAM) 50 MG tablet Take 1 tablet (50 mg total) by mouth every 8 (eight) hours as needed. 30 tablet 0  . valACYclovir (VALTREX) 1000 MG tablet Take 1 tablet (1,000 mg total) by mouth 3 (three) times daily. 21 tablet 0   No facility-administered medications prior to visit.     PAST MEDICAL HISTORY: Past Medical History:  Diagnosis Date  . Dementia   . Hepatitis   . Hyperlipidemia   . Osteopenia     PAST SURGICAL HISTORY: Past Surgical History:  Procedure Laterality Date  . UMBILICAL HERNIA REPAIR    . VAGINAL HYSTERECTOMY      FAMILY  HISTORY: Family History  Problem Relation Age of Onset  . Coronary artery disease Mother   . Coronary artery disease Father   . Diabetes Mellitus I Sister   . Cancer Sister   . Glaucoma Sister   . Breast cancer Maternal Aunt     SOCIAL HISTORY: Social History   Socioeconomic History  . Marital status: Married    Spouse name: Not on file  . Number of children: 3  . Years of education: 8111  . Highest education level: Not on file  Social Needs  . Financial resource strain: Not on file  . Food insecurity - worry: Not on file  . Food insecurity - inability: Not on file  . Transportation needs - medical: Not on file  . Transportation needs - non-medical: Not on file  Occupational History  . Occupation: Retired  Tobacco Use  . Smoking status: Never Smoker  . Smokeless tobacco: Never Used  Substance and Sexual Activity  . Alcohol use: No    Alcohol/week: 0.0 oz  . Drug use: No  . Sexual activity: Not on file  Other Topics Concern  . Not on file  Social History Narrative   Married.   3 children. 1 grandchild.   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   Enjoys gardening, spending time with family.   Right-handed   Caffeine: 2-3 cups coffee per day      PHYSICAL EXAM  Vitals:   01/08/17 0919  BP: 135/66  Pulse: (!) 47  Weight: 150 lb (68 kg)   Body mass index is 25.75 kg/m.  MMSE - Mini Mental State Exam 01/08/2017 12/31/2015 04/07/2015  Orientation to time 1 4 2   Orientation to Place 2 4 5   Registration 3 3 3   Attention/ Calculation 0 5 4  Recall 1 2 1   Language- name 2 objects 2 2 2   Language- repeat 0 1 1  Language- follow 3 step command 3 3 3   Language- read & follow direction 1 1 1   Write a sentence 1 1 1   Copy design 1 1 1   Total score 15 27 24      Generalized: Well developed, in no acute distress   Neurological examination  Mentation: Alert.. Follows all commands speech and language fluent Cranial nerve II-XII: Pupils were equal round  reactive to light. Extraocular movements were full, visual field were full on confrontational test. Facial sensation and strength were normal. Uvula tongue midline. Head turning and shoulder shrug  were normal and symmetric. Motor: The motor testing reveals 5 over 5 strength of all 4 extremities. Good symmetric motor tone is noted throughout.  Sensory: Sensory testing is intact to soft touch on all 4 extremities. No evidence of extinction is noted.  Coordination: Cerebellar testing reveals good finger-nose-finger and heel-to-shin bilaterally.  Gait and station: Gait is normal. Tandem gait is normal.  Romberg is negative. No drift is seen.  Reflexes: Deep tendon reflexes are symmetric and normal bilaterally.   DIAGNOSTIC DATA (LABS, IMAGING, TESTING) - I reviewed patient records, labs, notes, testing and imaging myself where available.  Lab Results  Component Value Date   WBC 4.8 11/01/2016   HGB 13.2 11/01/2016   HCT 41.3 11/01/2016   MCV 96.8 11/01/2016   PLT 210.0 11/01/2016      Component Value Date/Time   NA 139 11/01/2016 1034   K 4.1 11/01/2016 1034   CL 104 11/01/2016 1034   CO2 31 11/01/2016 1034   GLUCOSE 75 11/01/2016 1034   BUN 16 11/01/2016 1034   CREATININE 0.83 11/01/2016 1034   CALCIUM 9.1 11/01/2016 1034   PROT 6.4 10/06/2016 1333   ALBUMIN 3.7 10/06/2016 1333   AST 29 10/06/2016 1333   ALT 21 10/06/2016 1333   ALKPHOS 94 10/06/2016 1333   BILITOT 0.4 10/06/2016 1333   GFRNONAA >60 08/17/2016 2338   GFRAA >60 08/17/2016 2338   Lab Results  Component Value Date   CHOL 170 05/16/2016   HDL 73.50 05/16/2016   LDLCALC 87 05/16/2016   TRIG 50.0 05/16/2016   CHOLHDL 2 05/16/2016   No results found for: HGBA1C Lab Results  Component Value Date   VITAMINB12 560 12/31/2015   Lab Results  Component Value Date   TSH 2.080 12/31/2015      ASSESSMENT AND PLAN 76 y.o. year old female  has a past medical history of Dementia, Hepatitis, Hyperlipidemia, and  Osteopenia. here with:  1. Memory Disturbance 2.  Depression  The patient's memory score has declined since the last visit.  MMSE today is 15 out of 30 was previously the 27 out of 30.  Blood work and urinalysis completed in September was unremarkable.  The patient will remain on Aricept and Namenda.  I will increase Celexa to 20 mg daily.  I have advised that if hersymptoms worsen or she develops new symptoms they should let us know.  She will follow-up in 6 months or sooner if needed.     Butch Penny, MSN, NP-C 01/08/2017, 9:38 AM [facility] [address], [address] [place], [place] [address] [phone]

## 2017-01-08 NOTE — Patient Instructions (Signed)
Your Plan:  Continue Aricept and Namenda  Increase Celexa 20 mg daily If your symptoms worsen or you develop new symptoms please let us know.   Thank you for coming to [place] [facility]. I hope we have been able to provide you high quality care today.  You may receive a patient satisfaction survey over the next few weeks. We would appreciate your feedback and comments so that we may continue to improve ourselves and the health of our patients.

## 2017-01-08 NOTE — Progress Notes (Signed)
I have read the note, and I agree with the clinical assessment and plan.  Olliver Boyadjian KEITH   

## 2017-02-14 ENCOUNTER — Ambulatory Visit (INDEPENDENT_AMBULATORY_CARE_PROVIDER_SITE_OTHER): Payer: Medicare PPO | Admitting: Primary Care

## 2017-02-14 ENCOUNTER — Encounter: Payer: Self-pay | Admitting: Primary Care

## 2017-02-14 VITALS — BP 122/72 | HR 48 | Temp 97.7°F | Ht 64.0 in | Wt 150.4 lb

## 2017-02-14 DIAGNOSIS — Z23 Encounter for immunization: Secondary | ICD-10-CM

## 2017-02-14 NOTE — Patient Instructions (Signed)
Please call me if your symptoms return.  Make sure to eat a good dinner tonight. Get plenty of water.   It was a pleasure to see you today!

## 2017-02-14 NOTE — Progress Notes (Signed)
Subjective:    Patient ID: Rachel Garza, female    DOB: 02/23/1941, 76 y.o.   MRN: [id number]  HPI  Rachel Garza is a 76 year old female with a history of mild cogitative impairment, depression, hyperlipidemia who presents today with a chief complaint of dizziness. She also felt nauseated, shaky, lightheaded. Symptoms began this morning upon waking. She was at the hospital with her husband until 2 am this morning. Her symptoms have gradually improved throughout the day today and have nearly resolved.   She denies vomiting, fevers, diarrhea, abdominal pain, unilateral weakness, falls, acute mental changes, numbness/tingling.   Review of Systems  Constitutional: Negative for fatigue and fever.  Respiratory: Negative for cough.   Gastrointestinal: Negative for abdominal pain, blood in stool, diarrhea, nausea and vomiting.  Neurological: Negative for dizziness, syncope, speech difficulty, numbness and headaches.       Past Medical History:  Diagnosis Date  . Dementia   . Hepatitis   . Hyperlipidemia   . Osteopenia      Social History   Socioeconomic History  . Marital status: Married    Spouse name: Not on file  . Number of children: 3  . Years of education: 5311  . Highest education level: Not on file  Social Needs  . Financial resource strain: Not on file  . Food insecurity - worry: Not on file  . Food insecurity - inability: Not on file  . Transportation needs - medical: Not on file  . Transportation needs - non-medical: Not on file  Occupational History  . Occupation: Retired  Tobacco Use  . Smoking status: Never Smoker  . Smokeless tobacco: Never Used  Substance and Sexual Activity  . Alcohol use: No    Alcohol/week: 0.0 oz  . Drug use: No  . Sexual activity: Not on file  Other Topics Concern  . Not on file  Social History Narrative   Married.   3 children. 1 grandchild.   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   Enjoys gardening, spending time with  family.   Right-handed   Caffeine: 2-3 cups coffee per day    Past Surgical History:  Procedure Laterality Date  . UMBILICAL HERNIA REPAIR    . VAGINAL HYSTERECTOMY      Family History  Problem Relation Age of Onset  . Coronary artery disease Mother   . Coronary artery disease Father   . Diabetes Mellitus I Sister   . Cancer Sister   . Glaucoma Sister   . Breast cancer Maternal Aunt     Allergies  Allergen Reactions  . Niacin And Related     Current Outpatient Medications on File Prior to Visit  Medication Sig Dispense Refill  . alendronate (FOSAMAX) 70 MG tablet TAKE 1 TABLET BY MOUTH ONCE EVERY 7 DAYS. TAKE WITH A FULL GLASS OF WATER ON AN EMPTY STOMACH. 12 tablet 3  . aspirin 81 MG tablet Take 81 mg by mouth daily after breakfast.     . BIOTIN 5000 PO Take 5,000 mcg by mouth daily after breakfast.     . citalopram (CELEXA) 20 MG tablet Take 1 tablet (20 mg total) daily by mouth. 30 tablet 11  . donepezil (ARICEPT) 23 MG TABS tablet Take 1 tablet (23 mg total) at bedtime by mouth. 90 tablet 3  . memantine (NAMENDA) 10 MG tablet Take 1 tablet (10 mg total) 2 (two) times daily by mouth. 180 tablet 3  . Multiple Vitamins-Minerals (MULTIVITAMIN PO) Take 1  tablet by mouth daily after breakfast.     . Omega-3 Fatty Acids (FISH OIL PO) Take 1 tablet by mouth daily after breakfast.     . ranitidine (ZANTAC) 150 MG tablet TAKE 1 TABLET BY MOUTH ONCE TO TWICE DAILY FOR HEARTBURN. 180 tablet 1  . simvastatin (ZOCOR) 20 MG tablet TAKE 1 TABLET BY MOUTH EVERY DAY 30 tablet 5   No current facility-administered medications on file prior to visit.     BP 122/72   Pulse (!) 48   Temp 97.7 F (36.5 C) (Oral)   Ht 5\' 4"  (1.626 m)   Wt 150 lb 6.4 oz (68.2 kg)   SpO2 97%   BMI 25.82 kg/m    Objective:   Physical Exam  Constitutional: She is oriented to person, place, and time. She appears well-nourished.  HENT:  Right Ear: Tympanic membrane and ear canal normal.  Left Ear:  Tympanic membrane and ear canal normal.  Neck: Neck supple.  Cardiovascular: Normal rate and regular rhythm.  Pulmonary/Chest: Effort normal and breath sounds normal.  Abdominal: Soft. Bowel sounds are normal. There is no tenderness.  Neurological: She is alert and oriented to person, place, and time. No cranial nerve deficit.  Skin: Skin is warm and dry.          Assessment & Plan:  Dizziness:  Also with nausea, generalized weakness, shaky. Given that symptoms have nearly resolved and lack of negative neurological exam/HEENT exam, respiratory exam, symptoms likely secondary to exhaustion and/or mild dehydration. Recommended a good night sleep with a hearty dinner tonight. She and her daughter will update if symptoms return.  Morrie Sheldonlark,Cana Mignano Kendal, NP

## 2017-02-15 ENCOUNTER — Ambulatory Visit: Payer: Medicare PPO | Admitting: Family Medicine

## 2017-03-05 ENCOUNTER — Other Ambulatory Visit: Payer: Self-pay | Admitting: *Deleted

## 2017-03-05 DIAGNOSIS — F329 Major depressive disorder, single episode, unspecified: Secondary | ICD-10-CM

## 2017-03-05 DIAGNOSIS — F32A Depression, unspecified: Secondary | ICD-10-CM

## 2017-03-05 MED ORDER — CITALOPRAM HYDROBROMIDE 20 MG PO TABS: 20.0000 mg | ORAL_TABLET | Freq: Every day | ORAL | 3 refills | Status: DC

## 2017-03-05 NOTE — Telephone Encounter (Signed)
Received fax request for 90 day supply of Citalopram. Previous Rx changed to reflect.

## 2017-04-16 ENCOUNTER — Telehealth: Payer: Self-pay | Admitting: Adult Health

## 2017-04-16 NOTE — Telephone Encounter (Signed)
I spoke to daughter and made appt for pt on 04-19-17 at 1300.   She verbalized understanding.

## 2017-04-16 NOTE — Telephone Encounter (Signed)
Please set the patient up for revisit.  I have some openings this week.

## 2017-04-16 NOTE — Telephone Encounter (Signed)
Patient's daughter Rachel Garza is calling regarding increasing and worsening hallucinations.

## 2017-04-16 NOTE — Telephone Encounter (Signed)
I spoke to Rachel Garza.  Since last seen she is experiencing more and worsening hallucinations.  (She states she will wake up and be crying and sreaming.  She is hearing voices, seeing things that are not there.  (picking up things that are not there and doing so often that she is hurting her back).  No other medication changes from when last time in (we increased celexa to 20mg  daily).   Please advise.

## 2017-04-19 ENCOUNTER — Ambulatory Visit: Payer: Self-pay | Admitting: Adult Health

## 2017-04-19 NOTE — Telephone Encounter (Addendum)
Pt's daughter called she has appt today with Aundra MilletMegan but pt is very defiant today, throwing things, saying she is not going anywhere and they can't make her go. Please call to discuss with her at [phone].

## 2017-04-19 NOTE — Telephone Encounter (Addendum)
Spoke with daughter,  Sherry RuffingCherri, on [place] to discuss. She stated patient's husband told her the patient has refused to come. He had told her she was going to the dr today. She is refusing to dress, is "throwing things" and stated "no one is going to make her go anywhere". This RN advised that the family take her out and not state where they are going. Cherri requested the FU be rescheduled. She stated that she is afraid her mother will not come or if they get her to come, she will "fall apart when they get here". Cherri stated she doesn't want to put her mother through that.  This RN rescheduled for next Wed; Cherri stated they will take " a different approach to get her here". She verbalized appreciation.

## 2017-04-25 ENCOUNTER — Encounter: Payer: Self-pay | Admitting: Adult Health

## 2017-04-25 ENCOUNTER — Ambulatory Visit (INDEPENDENT_AMBULATORY_CARE_PROVIDER_SITE_OTHER): Payer: Medicare PPO | Admitting: Adult Health

## 2017-04-25 VITALS — BP 130/68 | HR 53 | Ht 64.0 in | Wt 164.6 lb

## 2017-04-25 DIAGNOSIS — F32A Depression, unspecified: Secondary | ICD-10-CM

## 2017-04-25 DIAGNOSIS — F329 Major depressive disorder, single episode, unspecified: Secondary | ICD-10-CM | POA: Diagnosis not present

## 2017-04-25 DIAGNOSIS — R413 Other amnesia: Secondary | ICD-10-CM

## 2017-04-25 NOTE — Progress Notes (Signed)
I have read the note, and I agree with the clinical assessment and plan.  Burgundy Matuszak K Rahman Ferrall   

## 2017-04-25 NOTE — Patient Instructions (Signed)
Your Plan:  Continue Aricept and Namenda Memory score is stable If your symptoms worsen or you develop new symptoms please let us know.    Thank you for coming to see us at [facility]. I hope we have been able to provide you high quality care today.  You may receive a patient satisfaction survey over the next few weeks. We would appreciate your feedback and comments so that we may continue to improve ourselves and the health of our patients.  

## 2017-04-25 NOTE — Progress Notes (Signed)
PATIENT: Rachel Garza DOB: Oct 09, 1940  REASON FOR VISIT: follow up HISTORY FROM: patient  HISTORY OF PRESENT ILLNESS: Today 04/25/17 Rachel Garza is a 77 year old female with a history of progressive memory disturbance.  She returns today for follow-up.  She is here with her daughter.  She remains on Aricept and Namenda.  Reports that she is tolerating the medication well.  She lives at home with her husband.  Reports that she is able to complete all ADLs independently.  Denies any trouble sleeping although daughter states that the patient's husband has mentioned that she has vivid dreams.  She is unsure how often this occurs.  The patient denies any changes with her appetite.  She no longer operates a motor vehicle.  She denies having to give up any hobbies due to her memory.  At the last visit we increased Celexa.  The daughter states that she has not noticed any crying spells since then.  She returns today for evaluation.  HISTORY 01/08/17:  Rachel Garza is a 77 year old female with a history of progressive memory disturbance.  She returns today for follow-up.  She reports that she lives at home with her husband.  She feels that her memory has remained the same but daughters feel that it is worse.  she does require some prompting with ADLs.  She is currently not operating a motor vehicle.  The daughters report that occasionally their dad will let her drive if he is with her.  They have noted more agitation before bedtime.  The daughters also report to the nurse that she is having more crying spells.  She remains on Celexa 10 mg daily. the patient is also on Aricept and Namenda.  She had blood work and urinalysis through her primary care in September that was unremarkable.  The daughters feel that there is been no sudden change in her memory but rather gradual since the last office visit.  She returns today for an evaluation.   REVIEW OF SYSTEMS: Out of a complete 14 system review of symptoms, the  patient complains only of the following symptoms, and all other reviewed systems are negative.  See HPI  ALLERGIES: Allergies  Allergen Reactions  . Niacin And Related     HOME MEDICATIONS: Outpatient Medications Prior to Visit  Medication Sig Dispense Refill  . alendronate (FOSAMAX) 70 MG tablet TAKE 1 TABLET BY MOUTH ONCE EVERY 7 DAYS. TAKE WITH A FULL GLASS OF WATER ON AN EMPTY STOMACH. 12 tablet 3  . aspirin 81 MG tablet Take 81 mg by mouth daily after breakfast.     . BIOTIN 5000 PO Take 5,000 mcg by mouth daily after breakfast.     . citalopram (CELEXA) 20 MG tablet Take 1 tablet (20 mg total) by mouth daily. 90 tablet 3  . donepezil (ARICEPT) 23 MG TABS tablet Take 1 tablet (23 mg total) at bedtime by mouth. 90 tablet 3  . memantine (NAMENDA) 10 MG tablet Take 1 tablet (10 mg total) 2 (two) times daily by mouth. 180 tablet 3  . Multiple Vitamins-Minerals (MULTIVITAMIN PO) Take 1 tablet by mouth daily after breakfast.     . Omega-3 Fatty Acids (FISH OIL PO) Take 1 tablet by mouth daily after breakfast.     . ranitidine (ZANTAC) 150 MG tablet TAKE 1 TABLET BY MOUTH ONCE TO TWICE DAILY FOR HEARTBURN. 180 tablet 1  . simvastatin (ZOCOR) 20 MG tablet TAKE 1 TABLET BY MOUTH EVERY DAY 30 tablet 5  No facility-administered medications prior to visit.     PAST MEDICAL HISTORY: Past Medical History:  Diagnosis Date  . Dementia   . Hepatitis   . Hyperlipidemia   . Osteopenia     PAST SURGICAL HISTORY: Past Surgical History:  Procedure Laterality Date  . UMBILICAL HERNIA REPAIR    . VAGINAL HYSTERECTOMY      FAMILY HISTORY: Family History  Problem Relation Age of Onset  . Coronary artery disease Mother   . Coronary artery disease Father   . Diabetes Mellitus I Sister   . Cancer Sister   . Glaucoma Sister   . Breast cancer Maternal Aunt     SOCIAL HISTORY: Social History   Socioeconomic History  . Marital status: Married    Spouse name: Not on file  . Number of  children: 3  . Years of education: 32  . Highest education level: Not on file  Social Needs  . Financial resource strain: Not on file  . Food insecurity - worry: Not on file  . Food insecurity - inability: Not on file  . Transportation needs - medical: Not on file  . Transportation needs - non-medical: Not on file  Occupational History  . Occupation: Retired  Tobacco Use  . Smoking status: Never Smoker  . Smokeless tobacco: Never Used  Substance and Sexual Activity  . Alcohol use: No    Alcohol/week: 0.0 oz  . Drug use: No  . Sexual activity: Not on file  Other Topics Concern  . Not on file  Social History Narrative   Married.   3 children. 1 grandchild.   Retired. Once worked as a Youth worker.   Enjoys gardening, spending time with family.   Right-handed   Caffeine: 2-3 cups coffee per day      PHYSICAL EXAM  Vitals:   04/25/17 0914  BP: 130/68  Pulse: (!) 53  Weight: 164 lb 9.6 oz (74.7 kg)  Height: 5\' 4"  (1.626 m)   Body mass index is 28.25 kg/m.   MMSE - Mini Mental State Exam 04/25/2017 01/08/2017 12/31/2015  Orientation to time 0 1 4  Orientation to Place 3 2 4   Registration 3 3 3   Attention/ Calculation 0 0 5  Recall 0 1 2  Language- name 2 objects 2 2 2   Language- repeat 1 0 1  Language- follow 3 step command 3 3 3   Language- read & follow direction 1 1 1   Write a sentence 0 1 1  Copy design 1 1 1   Total score 14 15 27      Generalized: Well developed, in no acute distress   Neurological examination  Mentation: Alert oriented to time, place, history taking. Follows all commands speech and language fluent Cranial nerve II-XII: Pupils were equal round reactive to light. Extraocular movements were full, visual field were full on confrontational test. Facial sensation and strength were normal. Uvula tongue midline. Head turning and shoulder shrug  were normal and symmetric. Motor: The motor testing reveals 5 over 5 strength of all 4 extremities.  Good symmetric motor tone is noted throughout.  Sensory: Sensory testing is intact to soft touch on all 4 extremities. No evidence of extinction is noted.  Coordination: Cerebellar testing reveals good finger-nose-finger and heel-to-shin bilaterally.  Gait and station: Gait is normal. Tandem gait is normal. Romberg is negative. No drift is seen.  Reflexes: Deep tendon reflexes are symmetric and normal bilaterally.   DIAGNOSTIC DATA (LABS, IMAGING, TESTING) - I reviewed patient records, labs,  notes, testing and imaging myself where available.  Lab Results  Component Value Date   WBC 4.8 11/01/2016   HGB 13.2 11/01/2016   HCT 41.3 11/01/2016   MCV 96.8 11/01/2016   PLT 210.0 11/01/2016      Component Value Date/Time   NA 139 11/01/2016 1034   K 4.1 11/01/2016 1034   CL 104 11/01/2016 1034   CO2 31 11/01/2016 1034   GLUCOSE 75 11/01/2016 1034   BUN 16 11/01/2016 1034   CREATININE 0.83 11/01/2016 1034   CALCIUM 9.1 11/01/2016 1034   PROT 6.4 10/06/2016 1333   ALBUMIN 3.7 10/06/2016 1333   AST 29 10/06/2016 1333   ALT 21 10/06/2016 1333   ALKPHOS 94 10/06/2016 1333   BILITOT 0.4 10/06/2016 1333   GFRNONAA >60 08/17/2016 2338   GFRAA >60 08/17/2016 2338   Lab Results  Component Value Date   CHOL 170 05/16/2016   HDL 73.50 05/16/2016   LDLCALC 87 05/16/2016   TRIG 50.0 05/16/2016   CHOLHDL 2 05/16/2016      ASSESSMENT AND PLAN 77 y.o. year old female  has a past medical history of Dementia, Hepatitis, Hyperlipidemia, and Osteopenia. here with :  1.  Memory disturbance  The patient's memory score has remained stable.  She will continue on Aricept and Namenda.  I did advise that if she is having vivid dreams nightly this could be a side effect of Aricept. If this is occurring nightly they can consider taking Aricept in the morning to see if the vivid dreams subside. advised that if her symptoms worsen or she develops new symptoms she should let us know.  She will also  remain on Celexa for her mood.  She will follow-up in 6 months or sooner if needed.  I spent 15 minutes with the patient. 50% of this time was spent discussing her medications.     Butch PennyMegan Emalea Mix, MSN, NP-C 04/25/2017, 9:21 AM [facility] [address], [address] [place], [place] [id number] [phone]

## 2017-05-19 ENCOUNTER — Other Ambulatory Visit: Payer: Self-pay | Admitting: Primary Care

## 2017-05-25 ENCOUNTER — Other Ambulatory Visit: Payer: Self-pay | Admitting: Primary Care

## 2017-05-25 DIAGNOSIS — Z1231 Encounter for screening mammogram for malignant neoplasm of breast: Secondary | ICD-10-CM

## 2017-05-28 ENCOUNTER — Encounter: Payer: Self-pay | Admitting: Primary Care

## 2017-05-28 ENCOUNTER — Ambulatory Visit (INDEPENDENT_AMBULATORY_CARE_PROVIDER_SITE_OTHER): Payer: Medicare PPO | Admitting: Primary Care

## 2017-05-28 VITALS — BP 122/74 | HR 52 | Temp 98.0°F | Ht 62.5 in | Wt 160.8 lb

## 2017-05-28 DIAGNOSIS — K219 Gastro-esophageal reflux disease without esophagitis: Secondary | ICD-10-CM | POA: Insufficient documentation

## 2017-05-28 DIAGNOSIS — E782 Mixed hyperlipidemia: Secondary | ICD-10-CM

## 2017-05-28 DIAGNOSIS — M810 Age-related osteoporosis without current pathological fracture: Secondary | ICD-10-CM

## 2017-05-28 DIAGNOSIS — G3184 Mild cognitive impairment, so stated: Secondary | ICD-10-CM | POA: Diagnosis not present

## 2017-05-28 LAB — LIPID PANEL
CHOL/HDL RATIO: 2
Cholesterol: 174 mg/dL (ref 0–200)
HDL: 74.9 mg/dL (ref >39.00)
LDL Cholesterol: 90 mg/dL (ref 0–99)
NonHDL: 99.56
Triglycerides: 49 mg/dL (ref 0.0–149.0)
VLDL: 9.8 mg/dL (ref 0.0–40.0)

## 2017-05-28 LAB — COMPREHENSIVE METABOLIC PANEL
ALK PHOS: 69 U/L (ref 39–117)
ALT: 26 U/L (ref 0–35)
AST: 34 U/L (ref 0–37)
Albumin: 3.8 g/dL (ref 3.5–5.2)
BILIRUBIN TOTAL: 0.5 mg/dL (ref 0.2–1.2)
BUN: 12 mg/dL (ref 6–23)
CALCIUM: 9.5 mg/dL (ref 8.4–10.5)
CHLORIDE: 105 meq/L (ref 96–112)
CO2: 34 mEq/L — ABNORMAL HIGH (ref 19–32)
CREATININE: 0.83 mg/dL (ref 0.40–1.20)
GFR: 71.14 mL/min (ref >60.00)
Glucose, Bld: 104 mg/dL — ABNORMAL HIGH (ref 70–99)
Potassium: 4.5 mEq/L (ref 3.5–5.1)
SODIUM: 143 meq/L (ref 135–145)
TOTAL PROTEIN: 6.8 g/dL (ref 6.0–8.3)

## 2017-05-28 MED ORDER — SIMVASTATIN 20 MG PO TABS: ORAL_TABLET | ORAL | 3 refills | Status: DC

## 2017-05-28 MED ORDER — RANITIDINE HCL 150 MG PO TABS: ORAL_TABLET | ORAL | 3 refills | Status: DC

## 2017-05-28 NOTE — Patient Instructions (Signed)
Stop by the lab prior to leaving today. I will notify you of your results once received.   I sent refills for Simvastatin and ranitidine medications to your pharmacy.  It was a pleasure to see you today!

## 2017-05-28 NOTE — Assessment & Plan Note (Signed)
Compliant to alendronate, continue same.

## 2017-05-28 NOTE — Assessment & Plan Note (Signed)
Due for repeat lipid panel and CMP today. Continue Simvastatin.

## 2017-05-28 NOTE — Assessment & Plan Note (Signed)
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 Daughter will be calling neurologist with an update.

## 2017-05-28 NOTE — Progress Notes (Signed)
Subjective:    Patient ID: Rachel LevyMartha A Bossman, female    DOB: 06/11/1940, 77 y.o.   MRN: [id number]  HPI  [name] is a 77 year old female who presents today for follow up.  1) GERD: Currently managed on ranitidine 150 mg twice daily. She denies esophageal burning/reflux, belching, abdominal pain. She is needing a refill today.  2) Depression/Mild Cognitive Impairment: Currently managed on citalopram 20 mg, donepezil 23 mg, memantine 10 mg. She is following with neurology and had a visit one month ago. No changes were made to medications except to take donepezil at bedtime due to vivid dreams and hallucination.  Since that visit she continues to experience hallucination and aggression to her husband mostly. This occurs in the evenings despite switching donepezil to evening administration. Her daughter plans to call her neurologist with an update.   3) Hyperlipidemia: Currently managed on Simvastatin 20 mg. Her last lipid panel was unremarkable one year ago. She denies myalgias.   4) Osteoporosis: Currently managed on Fosamax for which she takes once weekly. She denies any GERD symptoms. Her last bone density test was in February 2018.   Review of Systems  Respiratory: Negative for shortness of breath.   Cardiovascular: Negative for chest pain.  Gastrointestinal:       Denies GERD symptoms  Neurological: Negative for dizziness and headaches.  Psychiatric/Behavioral:       See HPI       Past Medical History:  Diagnosis Date  . Dementia   . Hepatitis   . Hyperlipidemia   . Osteopenia      Social History   Socioeconomic History  . Marital status: Married    Spouse name: Not on file  . Number of children: 3  . Years of education: 1911  . Highest education level: Not on file  Occupational History  . Occupation: Retired  Engineer, productionocial Needs  . Financial resource strain: Not on file  . Food insecurity:    Worry: Not on file    Inability: Not on file  . Transportation needs:   Medical: Not on file    Non-medical: Not on file  Tobacco Use  . Smoking status: Never Smoker  . Smokeless tobacco: Never Used  Substance and Sexual Activity  . Alcohol use: No    Alcohol/week: 0.0 oz  . Drug use: No  . Sexual activity: Not on file  Lifestyle  . Physical activity:    Days per week: Not on file    Minutes per session: Not on file  . Stress: Not on file  Relationships  . Social connections:    Talks on phone: Not on file    Gets together: Not on file    Attends religious service: Not on file    Active member of club or organization: Not on file    Attends meetings of clubs or organizations: Not on file    Relationship status: Not on file  . Intimate partner violence:    Fear of current or ex partner: Not on file    Emotionally abused: Not on file    Physically abused: Not on file    Forced sexual activity: Not on file  Other Topics Concern  . Not on file  Social History Narrative   Married.   3 children. 1 grandchild.   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   Enjoys gardening, spending time with family.   Right-handed   Caffeine: 2-3 cups coffee per day    Past  Surgical History:  Procedure Laterality Date  . UMBILICAL HERNIA REPAIR    . VAGINAL HYSTERECTOMY      Family History  Problem Relation Age of Onset  . Coronary artery disease Mother   . Coronary artery disease Father   . Diabetes Mellitus I Sister   . Cancer Sister   . Glaucoma Sister   . Breast cancer Maternal Aunt     Allergies  Allergen Reactions  . Niacin And Related     Current Outpatient Medications on File Prior to Visit  Medication Sig Dispense Refill  . alendronate (FOSAMAX) 70 MG tablet TAKE 1 TABLET BY MOUTH ONCE EVERY 7 DAYS. TAKE WITH A FULL GLASS OF WATER ON AN EMPTY STOMACH. 12 tablet 3  . aspirin 81 MG tablet Take 81 mg by mouth daily after breakfast.     . BIOTIN 5000 PO Take 5,000 mcg by mouth daily after breakfast.     . citalopram (CELEXA) 20 MG tablet  Take 1 tablet (20 mg total) by mouth daily. 90 tablet 3  . donepezil (ARICEPT) 23 MG TABS tablet Take 1 tablet (23 mg total) at bedtime by mouth. 90 tablet 3  . memantine (NAMENDA) 10 MG tablet Take 1 tablet (10 mg total) 2 (two) times daily by mouth. 180 tablet 3  . Multiple Vitamins-Minerals (MULTIVITAMIN PO) Take 1 tablet by mouth daily after breakfast.     . Omega-3 Fatty Acids (FISH OIL PO) Take 1 tablet by mouth daily after breakfast.      No current facility-administered medications on file prior to visit.     BP 122/74   Pulse (!) 52   Temp 98 F (36.7 C) (Oral)   Ht 5\' 4"  (1.626 m)   Wt 160 lb 12 oz (72.9 kg)   SpO2 97%   BMI 27.59 kg/m    Objective:   Physical Exam  Constitutional: She appears well-nourished.  Neck: Neck supple.  Cardiovascular: Normal rate and regular rhythm.  Pulmonary/Chest: Effort normal and breath sounds normal.  Neurological: She is alert.  Answers most questions appropriately.   Skin: Skin is warm and dry.  Psychiatric: She has a normal mood and affect.          Assessment & Plan:

## 2017-05-28 NOTE — Assessment & Plan Note (Signed)
Doing well on Zantac 150 mg BID. Continue same. Reviewed correct administration of alendronate weekly in order to avoid any breakthrough GERD.

## 2017-05-29 ENCOUNTER — Encounter: Payer: Self-pay | Admitting: *Deleted

## 2017-06-14 ENCOUNTER — Ambulatory Visit: Payer: Medicare PPO

## 2017-07-16 ENCOUNTER — Ambulatory Visit: Payer: Medicare PPO | Admitting: Neurology

## 2017-08-25 ENCOUNTER — Other Ambulatory Visit: Payer: Self-pay | Admitting: Primary Care

## 2017-08-25 DIAGNOSIS — M810 Age-related osteoporosis without current pathological fracture: Secondary | ICD-10-CM

## 2017-10-26 ENCOUNTER — Ambulatory Visit: Payer: Medicare PPO | Admitting: Neurology

## 2017-12-20 ENCOUNTER — Ambulatory Visit: Payer: Medicare PPO

## 2018-01-26 ENCOUNTER — Other Ambulatory Visit: Payer: Self-pay | Admitting: Primary Care

## 2018-01-26 DIAGNOSIS — M810 Age-related osteoporosis without current pathological fracture: Secondary | ICD-10-CM

## 2018-03-06 ENCOUNTER — Other Ambulatory Visit: Payer: Self-pay | Admitting: Neurology

## 2018-03-06 ENCOUNTER — Other Ambulatory Visit: Payer: Self-pay | Admitting: Adult Health

## 2018-03-06 DIAGNOSIS — F329 Major depressive disorder, single episode, unspecified: Secondary | ICD-10-CM

## 2018-03-06 DIAGNOSIS — F32A Depression, unspecified: Secondary | ICD-10-CM

## 2018-03-29 ENCOUNTER — Telehealth: Payer: Self-pay

## 2018-03-29 NOTE — Telephone Encounter (Signed)
Spoken to patient's daughter and schedule appt on 04/10/2018

## 2018-03-29 NOTE — Telephone Encounter (Signed)
Forwarding to Carmie End CMA to call patient's daughter.

## 2018-03-29 NOTE — Telephone Encounter (Signed)
Yes, needs office visit. Please schedule.

## 2018-03-29 NOTE — Telephone Encounter (Signed)
Message left for patient to return my call.  

## 2018-03-29 NOTE — Telephone Encounter (Signed)
Daughter calls in to report that her mother is having a progression of her alzheimers.  They are noting hallucinations and increased anxiety.    Daughter would like to discuss medication management options for symptoms.    As this is a change in patient's condition and she hasn't been seen since April, she will likely need an appointment as there could be other acute issues going on but will defer to K. Chestine Spore, NP for further advice.

## 2018-04-09 ENCOUNTER — Telehealth: Payer: Self-pay

## 2018-04-09 NOTE — Telephone Encounter (Signed)
My chart message reviewed and I responded.

## 2018-04-09 NOTE — Telephone Encounter (Signed)
Pt's daughter, Sherry Ruffing (on dpr), is calling stating she sent Jae Dire a MyChart message concerning the pt that she wants to make sure Jae Dire sees before OV tomorrow.  Cherri can be reached at [phone].

## 2018-04-10 ENCOUNTER — Encounter: Payer: Self-pay | Admitting: Primary Care

## 2018-04-10 ENCOUNTER — Ambulatory Visit (INDEPENDENT_AMBULATORY_CARE_PROVIDER_SITE_OTHER): Payer: Medicare Other | Admitting: Primary Care

## 2018-04-10 VITALS — BP 136/86 | HR 53 | Temp 97.9°F | Ht 62.5 in | Wt 171.0 lb

## 2018-04-10 DIAGNOSIS — F0391 Unspecified dementia with behavioral disturbance: Secondary | ICD-10-CM | POA: Diagnosis not present

## 2018-04-10 DIAGNOSIS — Z23 Encounter for immunization: Secondary | ICD-10-CM

## 2018-04-10 DIAGNOSIS — K219 Gastro-esophageal reflux disease without esophagitis: Secondary | ICD-10-CM | POA: Diagnosis not present

## 2018-04-10 MED ORDER — OMEPRAZOLE 20 MG PO CPDR: 20.0000 mg | DELAYED_RELEASE_CAPSULE | Freq: Every day | ORAL | 0 refills | Status: DC

## 2018-04-10 MED ORDER — HYDROXYZINE HCL 10 MG PO TABS: ORAL_TABLET | ORAL | 0 refills | Status: DC

## 2018-04-10 NOTE — Assessment & Plan Note (Signed)
Continued decline in memory, now with behavorial disturbance in the evenings. Already managed on SSRI, Namenda, and Aricept, will continue. Long discussion with both patient and family regarding treatment. Discussed use of Seroquel and also the risk for increased mortality. We will start with a trial of hydroxyzine PRN with drowsiness precautions provided. Family will update. We will consider low dose Seroquel if needed.

## 2018-04-10 NOTE — Assessment & Plan Note (Signed)
Persistent belching x 1 month, no improvement with Zantac. Zantac will be discontinued given lack of improvement and also due to recent recall. Rx for omeprazole 20 mg sent to pharmacy. Family will update.

## 2018-04-10 NOTE — Patient Instructions (Signed)
Stop taking ranitidine (Zantac) for heartburn.  Start taking omeprazole 20 mg for heartburn.  You can take the hydroxyzine 10 mg (1-2 tablets) as needed for anxiety/panic attacks.  Please update me in a few weeks via My Chart.  It was a pleasure to see you today!

## 2018-04-10 NOTE — Addendum Note (Signed)
Addended by: Tawnya Crook on: 04/10/2018 05:06 PM   Modules accepted: Orders

## 2018-04-10 NOTE — Progress Notes (Signed)
Subjective:    Patient ID: Rachel Garza, female    DOB: [birth year], 78 y.o.   MRN: [id number]  HPI  Rachel Garza is a 78 year old female with a history of mild cognigative impairment who presents today with her family for symptoms of hallucinations and anxiety.  She was following with neurology and placed on donepezil 23 mg and memantine 20 mg. Her daughter recently sent a message via my chart endorsing the patient hallucinating and with symptoms of anxiety, mostly at night. She expressed that they no longer wished to see neurology and would like to continue her care here.  Today her daughter today endorses that the patient is seeing people "watching" her through the windows. Her mother is having panic attacks (including crying, shaking), also increased irritability and quicker to anger, decrease in short term memory and is not remembering things within 5 minutes of discussion. Her daughter is requesting medication to help calm her nerves and reduce delusions. Her daughter endorses a good appetite and doesn't not have trouble sleeping at night.   Patient today denies all of these symptoms.   Her daughter also reports persistent belching in her mother for the last 1-2 months. She's been taking Zantac 150 mg once daily without improvement. She denies esophageal burning, nausea, epigastric pain. She has a good appetite and is eating well.   Review of Systems  Constitutional: Negative for appetite change and unexpected weight change.  Respiratory: Negative for shortness of breath.   Cardiovascular: Negative for chest pain.  Gastrointestinal: Negative for abdominal pain, constipation, diarrhea and nausea.       Belching  Psychiatric/Behavioral:       See HPI       Past Medical History:  Diagnosis Date  . Dementia (HCC)   . Hepatitis   . Hyperlipidemia   . Osteopenia      Social History   Socioeconomic History  . Marital status: Married    Spouse name: Not on file  . Number of  children: 3  . Years of education: 37  . Highest education level: Not on file  Occupational History  . Occupation: Retired  Engineer, production  . Financial resource strain: Not on file  . Food insecurity:    Worry: Not on file    Inability: Not on file  . Transportation needs:    Medical: Not on file    Non-medical: Not on file  Tobacco Use  . Smoking status: Never Smoker  . Smokeless tobacco: Never Used  Substance and Sexual Activity  . Alcohol use: No    Alcohol/week: 0.0 standard drinks  . Drug use: No  . Sexual activity: Not on file  Lifestyle  . Physical activity:    Days per week: Not on file    Minutes per session: Not on file  . Stress: Not on file  Relationships  . Social connections:    Talks on phone: Not on file    Gets together: Not on file    Attends religious service: Not on file    Active member of club or organization: Not on file    Attends meetings of clubs or organizations: Not on file    Relationship status: Not on file  . Intimate partner violence:    Fear of current or ex partner: Not on file    Emotionally abused: Not on file    Physically abused: Not on file    Forced sexual activity: Not on file  Other Topics Concern  .  Not on file  Social History Narrative   Married.   3 children. 1 grandchild.   Retired. Once worked as a Youth worker.   Enjoys gardening, spending time with family.   Right-handed   Caffeine: 2-3 cups coffee per day    Past Surgical History:  Procedure Laterality Date  . UMBILICAL HERNIA REPAIR    . VAGINAL HYSTERECTOMY      Family History  Problem Relation Age of Onset  . Coronary artery disease Mother   . Coronary artery disease Father   . Diabetes Mellitus I Sister   . Cancer Sister   . Glaucoma Sister   . Breast cancer Maternal Aunt     Allergies  Allergen Reactions  . Niacin And Related     Current Outpatient Medications on File Prior to Visit  Medication Sig Dispense Refill  . alendronate  (FOSAMAX) 70 MG tablet TAKE 1 TABLET BY MOUTH ONCE EVERY 7 DAYS WITH A FULL GLASS OF WATER ON AN EMPTY STOMACH 12 tablet 0  . aspirin 81 MG tablet Take 81 mg by mouth daily after breakfast.     . BIOTIN 5000 PO Take 5,000 mcg by mouth daily after breakfast.     . citalopram (CELEXA) 20 MG tablet TAKE 1 TABLET BY MOUTH EVERY DAY 90 tablet 0  . donepezil (ARICEPT) 23 MG TABS tablet TAKE 1 TABLET BY MOUTH AT BEDTIME 90 tablet 0  . memantine (NAMENDA) 10 MG tablet TAKE 1 TABLET BY MOUTH 2 TIMES DAILY 180 tablet 0  . Multiple Vitamins-Minerals (MULTIVITAMIN PO) Take 1 tablet by mouth daily after breakfast.     . Omega-3 Fatty Acids (FISH OIL PO) Take 1 tablet by mouth daily after breakfast.     . ranitidine (ZANTAC) 150 MG tablet TAKE 1 TABLET BY MOUTH ONCE TO TWICE DAILY FOR HEARTBURN. 180 tablet 3  . simvastatin (ZOCOR) 20 MG tablet Take 1 tablet by mouth every evening for cholesterol. 90 tablet 3   No current facility-administered medications on file prior to visit.     BP 136/86   Pulse (!) 53   Temp 97.9 F (36.6 C) (Oral)   Ht 5' 2.5" (1.588 m)   Wt 171 lb (77.6 kg)   SpO2 97%   BMI 30.78 kg/m    Objective:   Physical Exam  Constitutional: She appears well-nourished.  Cardiovascular: Normal rate and regular rhythm.  Respiratory: Effort normal and breath sounds normal.  Neurological: She is alert.  Oriented to person and place, not time. Able to recall her home address but cannot recall what she ate for breakfast. Denies all symptoms in HPI. She does recognize that she belches.   Skin: Skin is warm and dry.           Assessment & Plan:

## 2018-04-20 ENCOUNTER — Other Ambulatory Visit: Payer: Self-pay | Admitting: Primary Care

## 2018-04-20 DIAGNOSIS — E2839 Other primary ovarian failure: Secondary | ICD-10-CM

## 2018-04-20 DIAGNOSIS — M810 Age-related osteoporosis without current pathological fracture: Secondary | ICD-10-CM

## 2018-04-23 NOTE — Telephone Encounter (Signed)
Last prescribed on 01/28/2018. Last office visit on 04/10/2018. No future appointment

## 2018-04-23 NOTE — Telephone Encounter (Signed)
Noted, refill sent to pharmacy. She is due for a repeat bone density scan, please notify patient's daughter and patient. I will put in an order, they will need to call [facility] to schedule.

## 2018-04-24 NOTE — Telephone Encounter (Signed)
Message left for patient to return my call.  

## 2018-04-29 ENCOUNTER — Other Ambulatory Visit: Payer: Self-pay | Admitting: Primary Care

## 2018-04-29 DIAGNOSIS — K219 Gastro-esophageal reflux disease without esophagitis: Secondary | ICD-10-CM

## 2018-05-01 ENCOUNTER — Other Ambulatory Visit: Payer: Self-pay | Admitting: Primary Care

## 2018-05-01 DIAGNOSIS — K219 Gastro-esophageal reflux disease without esophagitis: Secondary | ICD-10-CM

## 2018-05-03 ENCOUNTER — Other Ambulatory Visit: Payer: Self-pay | Admitting: Primary Care

## 2018-05-03 DIAGNOSIS — F0391 Unspecified dementia with behavioral disturbance: Secondary | ICD-10-CM

## 2018-05-07 NOTE — Telephone Encounter (Signed)
Last prescribed on 04/10/2018 . Last office visit on 04/10/2018. No future appointment

## 2018-05-07 NOTE — Telephone Encounter (Signed)
See my chart message

## 2018-05-31 ENCOUNTER — Ambulatory Visit (INDEPENDENT_AMBULATORY_CARE_PROVIDER_SITE_OTHER): Payer: Medicare Other | Admitting: Primary Care

## 2018-05-31 ENCOUNTER — Other Ambulatory Visit: Payer: Self-pay

## 2018-05-31 ENCOUNTER — Other Ambulatory Visit: Payer: Self-pay | Admitting: Primary Care

## 2018-05-31 ENCOUNTER — Other Ambulatory Visit: Payer: Self-pay | Admitting: Adult Health

## 2018-05-31 DIAGNOSIS — F0391 Unspecified dementia with behavioral disturbance: Secondary | ICD-10-CM

## 2018-05-31 DIAGNOSIS — K219 Gastro-esophageal reflux disease without esophagitis: Secondary | ICD-10-CM | POA: Diagnosis not present

## 2018-05-31 DIAGNOSIS — E782 Mixed hyperlipidemia: Secondary | ICD-10-CM

## 2018-05-31 MED ORDER — ESOMEPRAZOLE MAGNESIUM 40 MG PO CPDR: 40.0000 mg | DELAYED_RELEASE_CAPSULE | Freq: Every day | ORAL | 0 refills | Status: DC

## 2018-05-31 NOTE — Telephone Encounter (Signed)
lvm asking patient's daughter to call office.

## 2018-05-31 NOTE — Assessment & Plan Note (Signed)
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 Given that her family has noticed some improvement with hydroxyzine 10 mg, we will have them increase to 2 tablets around bedtime. Discussed to avoid giving this during the day.  Consider switching to Trazodone if needed. Daughter will update.

## 2018-05-31 NOTE — Patient Instructions (Signed)
Stop omeprazole 20 mg for belching/heartburn.  Start esomeprazole 40 mg (Nexium) once daily for heartburn.  Take the hydroxyzine in the evening for anxiety/sleep. You can increase to two tablets. Avoid taking this during the day.  Please update me in 1-2 weeks.  It was nice to see you! Mayra Reel, NP-C

## 2018-05-31 NOTE — Telephone Encounter (Signed)
Rachel Garza, can you get them set up for a visit with me? You can add them to my schedule for today but I'm going to meet with them on Saturday anytime after 1 pm

## 2018-05-31 NOTE — Assessment & Plan Note (Signed)
No improvement in belching with omeprazole 20 mg. Will change to Nexium 40 mg and have her daughter update. Consider adding Pepcid if need be vs other treatment.

## 2018-05-31 NOTE — Progress Notes (Signed)
Subjective:    Patient ID: Rachel Garza, female    DOB: 06-06-1940, 78 y.o.   MRN: [id number]  HPI  Virtual Visit via Video Note  I connected with Rachel Garza on 05/31/18 at 3:20 pm by a video enabled telemedicine application and verified that I am speaking with the correct person using two identifiers.   I discussed the limitations of evaluation and management by telemedicine and the availability of in person appointments. The patient expressed understanding and agreed to proceed. She is at home, I am in the office.  History of Present Illness:  Rachel Garza is a 78 year old female with a history of dementia with behavorial disturbance, depression, GERD who presents today with her daughter via video for follow up.  1) GERD: Currently managed on omeprazole 20 mg daily that was initiated in mid February 2020 for recurrent belching symptoms. At that time Zantac wasn't helping.   Her daughter reports continued belching since her prior visit. No improvement with omeprazole. She denies epigastric pain, esophageal burning. Her daughter has a history of GERD and takes Nexium 40 mg with complete relief, she would like for her mother to try this. Patient is not vomiting or reporting any abdominal pain.   2) Dementia with Behavorial Disturbances: Currently managed on memantine 10 mg BID, donepezil 23 mg daily, and citalopram 20 mg daily. Her husband is with her as well who endorses that she's seeing people that aren't actually there most everynight around 9 pm. She does not have these symptoms during the day. Her husband has been giving her the hydroxyzine scheduled twice daily, everyday, one tablet with occasional improvement. She is falling asleep around 9-10 pm and will wake at 2-3am. She will be up until the morning and will sleep during the day.   Observations/Objective:  Alert, actively doing a puzzle with family. No distress. Appears well nourished.   Assessment and Plan:  See problem  based charting.   Follow Up Instructions:  Stop omeprazole 20 mg for belching/heartburn.  Start esomeprazole 40 mg (Nexium) once daily for heartburn.  Take the hydroxyzine in the evening for anxiety/sleep. You can increase to two tablets. Avoid taking this during the day.  Please update me in 1-2 weeks.  It was nice to see you! Mayra Reel, NP-C    I discussed the assessment and treatment plan with the patient. The patient was provided an opportunity to ask questions and all were answered. The patient agreed with the plan and demonstrated an understanding of the instructions.   The patient was advised to call back or seek an in-person evaluation if the symptoms worsen or if the condition fails to improve as anticipated.     Doreene Nest, NP    Review of Systems  Constitutional: Negative for fever.  Gastrointestinal: Negative for abdominal pain and vomiting.       Belching  Psychiatric/Behavioral:       See HPI       Past Medical History:  Diagnosis Date  . Dementia (HCC)   . Hepatitis   . Hyperlipidemia   . Osteopenia      Social History   Socioeconomic History  . Marital status: Married    Spouse name: Not on file  . Number of children: 3  . Years of education: 60  . Highest education level: Not on file  Occupational History  . Occupation: Retired  Engineer, production  . Financial resource strain: Not on file  . Food insecurity:  Worry: Not on file    Inability: Not on file  . Transportation needs:    Medical: Not on file    Non-medical: Not on file  Tobacco Use  . Smoking status: Never Smoker  . Smokeless tobacco: Never Used  Substance and Sexual Activity  . Alcohol use: No    Alcohol/week: 0.0 standard drinks  . Drug use: No  . Sexual activity: Not on file  Lifestyle  . Physical activity:    Days per week: Not on file    Minutes per session: Not on file  . Stress: Not on file  Relationships  . Social connections:    Talks on phone: Not on  file    Gets together: Not on file    Attends religious service: Not on file    Active member of club or organization: Not on file    Attends meetings of clubs or organizations: Not on file    Relationship status: Not on file  . Intimate partner violence:    Fear of current or ex partner: Not on file    Emotionally abused: Not on file    Physically abused: Not on file    Forced sexual activity: Not on file  Other Topics Concern  . Not on file  Social History Narrative   Married.   3 children. 1 grandchild.   Retired. Once worked as a Youth worker.   Enjoys gardening, spending time with family.   Right-handed   Caffeine: 2-3 cups coffee per day    Past Surgical History:  Procedure Laterality Date  . UMBILICAL HERNIA REPAIR    . VAGINAL HYSTERECTOMY      Family History  Problem Relation Age of Onset  . Coronary artery disease Mother   . Coronary artery disease Father   . Diabetes Mellitus I Sister   . Cancer Sister   . Glaucoma Sister   . Breast cancer Maternal Aunt     Allergies  Allergen Reactions  . Niacin And Related     Current Outpatient Medications on File Prior to Visit  Medication Sig Dispense Refill  . alendronate (FOSAMAX) 70 MG tablet TAKE 1 TABLET BY MOUTH ONCE EVERY 7 DAYS WITH A FULL GLASS OF WATER ON AN EMPTY STOMACH 12 tablet 1  . aspirin 81 MG tablet Take 81 mg by mouth daily after breakfast.     . BIOTIN 5000 PO Take 5,000 mcg by mouth daily after breakfast.     . citalopram (CELEXA) 20 MG tablet TAKE 1 TABLET BY MOUTH EVERY DAY 90 tablet 0  . donepezil (ARICEPT) 23 MG TABS tablet TAKE 1 TABLET BY MOUTH AT BEDTIME 90 tablet 0  . hydrOXYzine (ATARAX/VISTARIL) 10 MG tablet TAKE 1 OR 2 TABLETS BY MOUTH AS NEEDED FOR ANXIETY. MAY CAUSE DROWSINESS. 180 tablet 0  . memantine (NAMENDA) 10 MG tablet TAKE 1 TABLET BY MOUTH 2 TIMES DAILY 180 tablet 0  . Multiple Vitamins-Minerals (MULTIVITAMIN PO) Take 1 tablet by mouth daily after breakfast.     .  Omega-3 Fatty Acids (FISH OIL PO) Take 1 tablet by mouth daily after breakfast.     . simvastatin (ZOCOR) 20 MG tablet TAKE 1 TABLET BY MOUTH EVERY DAY IN THE EVENING FOR CHOLESTEROL NEED APPOINTMENT FOR ANY MORE REFILL 90 tablet 0   No current facility-administered medications on file prior to visit.     There were no vitals taken for this visit.   Objective:   Physical Exam  Constitutional: She appears well-nourished.  Respiratory: Effort normal.  Neurological: She is alert.  Psychiatric: She has a normal mood and affect.           Assessment & Plan:

## 2018-06-01 ENCOUNTER — Other Ambulatory Visit: Payer: Self-pay | Admitting: Adult Health

## 2018-06-01 ENCOUNTER — Other Ambulatory Visit: Payer: Self-pay | Admitting: Neurology

## 2018-06-01 DIAGNOSIS — F329 Major depressive disorder, single episode, unspecified: Secondary | ICD-10-CM

## 2018-06-01 DIAGNOSIS — F32A Depression, unspecified: Secondary | ICD-10-CM

## 2018-06-03 DIAGNOSIS — F0391 Unspecified dementia with behavioral disturbance: Secondary | ICD-10-CM

## 2018-06-07 ENCOUNTER — Other Ambulatory Visit: Payer: Self-pay | Admitting: Primary Care

## 2018-06-07 DIAGNOSIS — K219 Gastro-esophageal reflux disease without esophagitis: Secondary | ICD-10-CM

## 2018-06-07 DIAGNOSIS — E782 Mixed hyperlipidemia: Secondary | ICD-10-CM

## 2018-06-10 ENCOUNTER — Telehealth: Payer: Self-pay | Admitting: Adult Health

## 2018-06-10 NOTE — Telephone Encounter (Signed)
06-10-2018 annual med check & a lot of hallucunations daughter(on DPR) has given verbal consent to file pt's insurance for Virtual Visit on 06-12-2018 email:[email or web address] pt daughter already has [employer] webex app/program https://www.webex.com/downloads.html downloaded to her phone

## 2018-06-11 NOTE — Telephone Encounter (Signed)
I contacted the pt and left a vm asking for a return call to complete pre charting for tomorrows vv.  Pt was given [facility] # and office hours.

## 2018-06-11 NOTE — Telephone Encounter (Signed)
Noted. Weblink has been submitted the the e-mail address provided.

## 2018-06-12 ENCOUNTER — Encounter: Payer: Self-pay | Admitting: Neurology

## 2018-06-12 ENCOUNTER — Ambulatory Visit (INDEPENDENT_AMBULATORY_CARE_PROVIDER_SITE_OTHER): Payer: Medicare Other | Admitting: Neurology

## 2018-06-12 ENCOUNTER — Other Ambulatory Visit: Payer: Self-pay

## 2018-06-12 DIAGNOSIS — G301 Alzheimer's disease with late onset: Secondary | ICD-10-CM | POA: Diagnosis not present

## 2018-06-12 DIAGNOSIS — F03A0 Unspecified dementia, mild, without behavioral disturbance, psychotic disturbance, mood disturbance, and anxiety: Secondary | ICD-10-CM

## 2018-06-12 DIAGNOSIS — F0281 Dementia in other diseases classified elsewhere with behavioral disturbance: Secondary | ICD-10-CM | POA: Diagnosis not present

## 2018-06-12 DIAGNOSIS — F0390 Unspecified dementia without behavioral disturbance: Secondary | ICD-10-CM

## 2018-06-12 MED ORDER — OLANZAPINE 2.5 MG PO TABS: 2.5000 mg | ORAL_TABLET | Freq: Every day | ORAL | 1 refills | Status: DC

## 2018-06-12 MED ORDER — DONEPEZIL HCL 23 MG PO TABS: 23.0000 mg | ORAL_TABLET | Freq: Every day | ORAL | 3 refills | Status: DC

## 2018-06-12 MED ORDER — MEMANTINE HCL 10 MG PO TABS: 10.0000 mg | ORAL_TABLET | Freq: Two times a day (BID) | ORAL | 3 refills | Status: DC

## 2018-06-12 NOTE — Progress Notes (Signed)
     Virtual Visit via Video Note  I connected with Rachel Garza on 06/12/18 at  3:00 PM EDT by a video enabled telemedicine application and verified that I am speaking with the correct person using two identifiers.   I discussed the limitations of evaluation and management by telemedicine and the availability of in person appointments. The patient expressed understanding and agreed to proceed.  History of Present Illness: Rachel Garza is a 78 year old right-handed white female with a history of a memory disturbance.  The patient has over the last 2 to 3 months begun having problems with hallucinations.  She is now having almost nightly events, she may see people about the house, she may get upset about this.  She will generally not go to bed until around 2 AM, but she might sleep till 2 PM the next day.  She is on high-dose Aricept, she tolerates this well.  She is not losing weight.  She requires assistance with keeping up with medications and appointments, she does not operate a motor vehicle.  She does not have any problems with balance or difficulty controlling the bowels or the bladder.  The family is concerned about these new visual hallucinations.  She recently was given Vistaril for some of the anxiety which is helpful but it does not control the hallucinations.   Observations/Objective: WebEx evaluation reveals that the patient is alert and cooperative.  The patient has good facial symmetry, she has normal speech pattern with no evidence of aphasia or dysarthria.  She is able to protrude the tongue in the midline with good lateral movement of the tongue.  Her ability to ambulate is normal.  Romberg is negative.  The blind Moca test reveals a score of 7/22.  Assessment and Plan: 1.  Alzheimer's disease, dementia  2.  Sundowning, hallucinations  The patient is having almost nightly events of hallucinations and agitation.  I will add low-dose Zyprexa in the evening, they will call in  several weeks for dose adjustments if needed.  The patient otherwise will follow-up in 6 months, she will continue her Aricept, a prescription for Aricept and Namenda will be sent in.  The patient will also continue on the Namenda.  Follow Up Instructions: 29-month follow-up, may see nurse practitioner.   I discussed the assessment and treatment plan with the patient. The patient was provided an opportunity to ask questions and all were answered. The patient agreed with the plan and demonstrated an understanding of the instructions.   The patient was advised to call back or seek an in-person evaluation if the symptoms worsen or if the condition fails to improve as anticipated.  I provided 25 minutes of non-face-to-face time during this encounter.   York Spaniel, MD

## 2018-06-13 ENCOUNTER — Other Ambulatory Visit: Payer: Self-pay

## 2018-06-13 DIAGNOSIS — F0391 Unspecified dementia with behavioral disturbance: Secondary | ICD-10-CM

## 2018-06-13 DIAGNOSIS — K219 Gastro-esophageal reflux disease without esophagitis: Secondary | ICD-10-CM

## 2018-06-13 DIAGNOSIS — E782 Mixed hyperlipidemia: Secondary | ICD-10-CM

## 2018-06-13 DIAGNOSIS — F329 Major depressive disorder, single episode, unspecified: Secondary | ICD-10-CM

## 2018-06-13 DIAGNOSIS — F32A Depression, unspecified: Secondary | ICD-10-CM

## 2018-06-14 MED ORDER — HYDROXYZINE HCL 10 MG PO TABS: ORAL_TABLET | ORAL | 0 refills | Status: DC

## 2018-06-14 MED ORDER — SIMVASTATIN 20 MG PO TABS: ORAL_TABLET | ORAL | 0 refills | Status: DC

## 2018-06-14 MED ORDER — ESOMEPRAZOLE MAGNESIUM 40 MG PO CPDR: 40.0000 mg | DELAYED_RELEASE_CAPSULE | Freq: Every day | ORAL | 0 refills | Status: DC

## 2018-06-16 ENCOUNTER — Other Ambulatory Visit: Payer: Self-pay | Admitting: Primary Care

## 2018-06-16 DIAGNOSIS — K219 Gastro-esophageal reflux disease without esophagitis: Secondary | ICD-10-CM

## 2018-06-24 ENCOUNTER — Other Ambulatory Visit: Payer: Self-pay | Admitting: Neurology

## 2018-06-24 MED ORDER — OLANZAPINE 5 MG PO TABS: 5.0000 mg | ORAL_TABLET | Freq: Every day | ORAL | 1 refills | Status: DC

## 2018-07-02 ENCOUNTER — Other Ambulatory Visit: Payer: Self-pay | Admitting: Primary Care

## 2018-07-02 ENCOUNTER — Other Ambulatory Visit: Payer: Self-pay | Admitting: Neurology

## 2018-07-02 DIAGNOSIS — F329 Major depressive disorder, single episode, unspecified: Secondary | ICD-10-CM

## 2018-07-02 DIAGNOSIS — F32A Depression, unspecified: Secondary | ICD-10-CM

## 2018-07-02 DIAGNOSIS — K219 Gastro-esophageal reflux disease without esophagitis: Secondary | ICD-10-CM

## 2018-07-18 ENCOUNTER — Other Ambulatory Visit: Payer: Self-pay | Admitting: Neurology

## 2018-07-20 ENCOUNTER — Other Ambulatory Visit: Payer: Self-pay | Admitting: Primary Care

## 2018-07-20 DIAGNOSIS — K219 Gastro-esophageal reflux disease without esophagitis: Secondary | ICD-10-CM

## 2018-08-09 ENCOUNTER — Other Ambulatory Visit: Payer: Self-pay | Admitting: Primary Care

## 2018-08-09 DIAGNOSIS — F0391 Unspecified dementia with behavioral disturbance: Secondary | ICD-10-CM

## 2018-08-09 NOTE — Telephone Encounter (Signed)
Do they actually need a refill? How many tablets is she giving Ms. Marcello at a time? How is she doing?

## 2018-08-09 NOTE — Telephone Encounter (Signed)
Last prescribed on 417/2020. Last appointment on 05/31/2018. No future appointment

## 2018-08-14 NOTE — Telephone Encounter (Signed)
Cherri (patient's daughter) called back and stated that I needed to call patient's spouse.  I have called patient's spouse. He stated that patient does need a refill. Patient is taking 1 tablet once a day. He stated that she is doing well when she is on this medication every day. He stated that without he noticed a difference in patient.

## 2018-08-14 NOTE — Telephone Encounter (Signed)
Noted, refill sent to pharmacy. 

## 2018-08-26 ENCOUNTER — Other Ambulatory Visit: Payer: Self-pay | Admitting: Neurology

## 2018-08-26 MED ORDER — CITALOPRAM HYDROBROMIDE 40 MG PO TABS: 40.0000 mg | ORAL_TABLET | Freq: Every day | ORAL | 1 refills | Status: DC

## 2018-08-26 MED ORDER — OLANZAPINE 10 MG PO TABS: 10.0000 mg | ORAL_TABLET | Freq: Every day | ORAL | 3 refills | Status: DC

## 2018-09-10 DIAGNOSIS — F0281 Dementia in other diseases classified elsewhere with behavioral disturbance: Secondary | ICD-10-CM

## 2018-09-10 DIAGNOSIS — F02818 Dementia in other diseases classified elsewhere, unspecified severity, with other behavioral disturbance: Secondary | ICD-10-CM

## 2018-09-19 ENCOUNTER — Other Ambulatory Visit: Payer: Self-pay | Admitting: Neurology

## 2018-09-20 ENCOUNTER — Telehealth: Payer: Self-pay | Admitting: Primary Care

## 2018-09-20 NOTE — Telephone Encounter (Signed)
Noted  

## 2018-09-20 NOTE — Telephone Encounter (Signed)
Patient will do a Doxy video visit with you next Tuesday for the Face to Face needed for the [facility] order. HH recommended that your order be for Skilled Nursing, OT and a Engineer, manufacturing.

## 2018-09-24 ENCOUNTER — Ambulatory Visit: Payer: BC Managed Care – PPO | Admitting: Primary Care

## 2018-09-24 ENCOUNTER — Encounter: Payer: Self-pay | Admitting: Primary Care

## 2018-09-24 ENCOUNTER — Ambulatory Visit (INDEPENDENT_AMBULATORY_CARE_PROVIDER_SITE_OTHER): Payer: Medicare Other | Admitting: Primary Care

## 2018-09-24 DIAGNOSIS — F0281 Dementia in other diseases classified elsewhere with behavioral disturbance: Secondary | ICD-10-CM | POA: Diagnosis not present

## 2018-09-24 DIAGNOSIS — E782 Mixed hyperlipidemia: Secondary | ICD-10-CM

## 2018-09-24 DIAGNOSIS — G301 Alzheimer's disease with late onset: Secondary | ICD-10-CM | POA: Diagnosis not present

## 2018-09-24 DIAGNOSIS — F02818 Dementia in other diseases classified elsewhere, unspecified severity, with other behavioral disturbance: Secondary | ICD-10-CM

## 2018-09-24 MED ORDER — SIMVASTATIN 20 MG PO TABS: ORAL_TABLET | ORAL | 0 refills | Status: DC

## 2018-09-24 NOTE — Assessment & Plan Note (Signed)
Family requesting assistance with ADLs through home health/occupational therapy.  Face-to-face visit occurred today via video. Referral placed to home health nursing, Occupational Therapy, bath aide.  Agree that this assistances needed.

## 2018-09-24 NOTE — Progress Notes (Signed)
Subjective:    Patient ID: Rachel Garza, female    DOB: 15-Jul-1940, 78 y.o.   MRN: [id number]  HPI  Virtual Visit via Video Note  I connected with Rachel Garza on 09/24/18 at  2:40 PM EDT by a video enabled telemedicine application and verified that I am speaking with the correct person using two identifiers.  Location: Patient: Home Provider: Office   I discussed the limitations of evaluation and management by telemedicine and the availability of in person appointments. The patient expressed understanding and agreed to proceed.  History of Present Illness:  Rachel Garza is a 78 year old female with a history of dementia with behavorial disturbance, depression, osteoporosis who presents today with her family who is requesting home health aide, occupational therapy referral.  She is also overdue for routine labs for chronic medical conditions.  She is out of her simvastatin.  Following with neurology for dementia, managed on Zyprexa 10 mg, memantine 10 mg BID, donepezil 23 mg. Also managed on citalopram 40 mg and hydroxyzine 10 mg PRN.  Family is requesting assistance with bathing, some toileting, and general hygiene due to her progressing dementia with behavioral disturbance.  She lives with her husband who is attempting to care for her.  We needed a face to face visit today for insurance purposes. Patient can toilet herself sometimes and feed herself most of the time.   Observations/Objective:  Alert and oriented to person and place. Appears well, not sickly. No distress. Speaking in complete sentences.  Assessment and Plan:   See problem based charting.  Follow Up Instructions:  You will be contacted regarding your referral to home health.  Please let us know if you have not been contacted within one week.   Call the main line to schedule your lab appointment as discussed.  It was a pleasure to see you today!    I discussed the assessment and treatment plan with the  patient. The patient was provided an opportunity to ask questions and all were answered. The patient agreed with the plan and demonstrated an understanding of the instructions.   The patient was advised to call back or seek an in-person evaluation if the symptoms worsen or if the condition fails to improve as anticipated.     Pleas Koch, NP    Review of Systems  Eyes: Negative for visual disturbance.  Respiratory: Negative for shortness of breath.   Cardiovascular: Negative for chest pain.  Neurological: Negative for dizziness and headaches.       Past Medical History:  Diagnosis Date  . Dementia ([place])   . Hepatitis   . Hyperlipidemia   . Osteopenia      Social History   Socioeconomic History  . Marital status: Married    Spouse name: Not on file  . Number of children: 3  . Years of education: 44  . Highest education level: Not on file  Occupational History  . Occupation: Retired  Scientific laboratory technician  . Financial resource strain: Not on file  . Food insecurity    Worry: Not on file    Inability: Not on file  . Transportation needs    Medical: Not on file    Non-medical: Not on file  Tobacco Use  . Smoking status: Never Smoker  . Smokeless tobacco: Never Used  Substance and Sexual Activity  . Alcohol use: No    Alcohol/week: 0.0 standard drinks  . Drug use: No  . Sexual activity: Not on file  Lifestyle  . Physical activity    Days per week: Not on file    Minutes per session: Not on file  . Stress: Not on file  Relationships  . Social Musicianconnections    Talks on phone: Not on file    Gets together: Not on file    Attends religious service: Not on file    Active member of club or organization: Not on file    Attends meetings of clubs or organizations: Not on file    Relationship status: Not on file  . Intimate partner violence    Fear of current or ex partner: Not on file    Emotionally abused: Not on file    Physically abused: Not on file    Forced  sexual activity: Not on file  Other Topics Concern  . Not on file  Social History Narrative   Married.   3 children. 1 grandchild.   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   Enjoys gardening, spending time with family.   Right-handed   Caffeine: 2-3 cups coffee per day    Past Surgical History:  Procedure Laterality Date  . UMBILICAL HERNIA REPAIR    . VAGINAL HYSTERECTOMY      Family History  Problem Relation Age of Onset  . Coronary artery disease Mother   . Coronary artery disease Father   . Diabetes Mellitus I Sister   . Cancer Sister   . Glaucoma Sister   . Breast cancer Maternal Aunt     Allergies  Allergen Reactions  . Niacin And Related     Current Outpatient Medications on File Prior to Visit  Medication Sig Dispense Refill  . alendronate (FOSAMAX) 70 MG tablet TAKE 1 TABLET BY MOUTH ONCE EVERY 7 DAYS WITH A FULL GLASS OF WATER ON AN EMPTY STOMACH 12 tablet 1  . aspirin 81 MG tablet Take 81 mg by mouth daily after breakfast.     . BIOTIN 5000 PO Take 5,000 mcg by mouth daily after breakfast.     . citalopram (CELEXA) 40 MG tablet Take 1 tablet (40 mg total) by mouth daily. 90 tablet 1  . donepezil (ARICEPT) 23 MG TABS tablet Take 1 tablet (23 mg total) by mouth at bedtime. 90 tablet 3  . esomeprazole (NEXIUM) 40 MG capsule Take 1 capsule (40 mg total) by mouth daily. For heartburn. 90 capsule 0  . hydrOXYzine (ATARAX/VISTARIL) 10 MG tablet TAKE 1 TABLET BY MOUTH AS NEEDED FOR ANXIETY. MAY CAUSE DROWSINESS. 90 tablet 1  . memantine (NAMENDA) 10 MG tablet Take 1 tablet (10 mg total) by mouth 2 (two) times daily. 180 tablet 3  . Multiple Vitamins-Minerals (MULTIVITAMIN PO) Take 1 tablet by mouth daily after breakfast.     . OLANZapine (ZYPREXA) 10 MG tablet TAKE 1 TABLET BY MOUTH EVERYDAY AT BEDTIME 90 tablet 1  . Omega-3 Fatty Acids (FISH OIL PO) Take 1 tablet by mouth daily after breakfast.      No current facility-administered medications on file prior to  visit.     BP 120/73   Pulse 90   Wt 181 lb 8 oz (82.3 kg)   BMI 32.67 kg/m    Objective:   Physical Exam  Constitutional: She appears well-nourished.  Respiratory: Effort normal.  Neurological: She is alert.  Psychiatric: She has a normal mood and affect.           Assessment & Plan:

## 2018-09-24 NOTE — Patient Instructions (Signed)
You will be contacted regarding your referral to home health.  Please let us know if you have not been contacted within one week.   Call the main line to schedule your lab appointment as discussed.  It was a pleasure to see you today!

## 2018-09-27 ENCOUNTER — Telehealth: Payer: Self-pay

## 2018-09-27 NOTE — Telephone Encounter (Signed)
Approved.  

## 2018-09-27 NOTE — Telephone Encounter (Signed)
Larene Beach, RN with [facility] left VM on triage line. 1. Verbal orders for Nursing 1 x week for 4 weeks to help with dementia resources 2.  HH Aide 3 x week for 4 weeks to help with basic hygiene needs. 3. Verbal order to do PT Evaluation for recent falls.   Please advise Larene Beach at [phone].

## 2018-09-29 ENCOUNTER — Other Ambulatory Visit: Payer: Self-pay | Admitting: Primary Care

## 2018-09-29 DIAGNOSIS — M810 Age-related osteoporosis without current pathological fracture: Secondary | ICD-10-CM

## 2018-09-30 NOTE — Telephone Encounter (Signed)
Message left for Shannon to return my call.  

## 2018-09-30 NOTE — Telephone Encounter (Signed)
Gave the approval of the verbal orders. 

## 2018-10-01 ENCOUNTER — Telehealth: Payer: Self-pay | Admitting: *Deleted

## 2018-10-01 ENCOUNTER — Telehealth: Payer: Self-pay

## 2018-10-01 NOTE — Telephone Encounter (Signed)
Amy PT with [facility] left v/m requesting verbal orders for [facility] PT 2 x a wk for 3 wks for balance training,transfers and safety in the home.

## 2018-10-01 NOTE — Telephone Encounter (Signed)
Approved.  

## 2018-10-01 NOTE — Telephone Encounter (Signed)
Patient needs to schedule bone density scan, due for repeat.  Please remind patient's daughter.  Refill sent to pharmacy.

## 2018-10-01 NOTE — Telephone Encounter (Signed)
Gave the approval for the verbal orders 

## 2018-10-01 NOTE — Telephone Encounter (Signed)
Approved for both orders, please remind them of clean catch technique.

## 2018-10-01 NOTE — Telephone Encounter (Signed)
Last prescribed on 04/23/2018. Last appointment on 09/24/2018. No future appointment

## 2018-10-01 NOTE — Telephone Encounter (Signed)
Rachel Garza with [facility] called stating that she is going out tomorrow to see the patient. Larene Beach stated that the patient's family thinks that she may have another UTI because she is having more confusion. Larene Beach is requesting an order to collect urine for a U/A and culture.

## 2018-10-02 NOTE — Telephone Encounter (Signed)
Larene Beach @ [facility] returned your call  Best number [phone]

## 2018-10-02 NOTE — Telephone Encounter (Signed)
Message left for patient's daughter to return my call.

## 2018-10-02 NOTE — Telephone Encounter (Signed)
Gave the approval for order

## 2018-10-02 NOTE — Telephone Encounter (Signed)
Message left  to return my call.

## 2018-10-03 ENCOUNTER — Encounter: Payer: Self-pay | Admitting: Family Medicine

## 2018-10-09 ENCOUNTER — Telehealth: Payer: Self-pay

## 2018-10-09 MED ORDER — CIPROFLOXACIN HCL 500 MG PO TABS: ORAL_TABLET | ORAL | 0 refills | Status: DC

## 2018-10-09 NOTE — Telephone Encounter (Signed)
Spoken to [place] and notified Anda Kraft Clark's comments. I have send the Rx of Cipro [facility] to [facility] in [address] as advised.

## 2018-10-09 NOTE — Telephone Encounter (Signed)
Stephanie with [facility] request cb about urine results and to see if pt needs abx. Colletta Maryland will have the office fax over a copy of urine report now and when [place] gets orders from Gentry Fitz NP; please call Colletta Maryland back and she will have pharmacy info if needed. Colletta Maryland said the culture showed Ecoli in urine. FYI to Gentry Fitz NP and Vallarie Mare CMA.

## 2018-10-09 NOTE — Telephone Encounter (Signed)
Letter send to remind patient to call to schedule the bone denity.

## 2018-10-09 NOTE — Telephone Encounter (Signed)
Reviewed urinalysis with culture. Culture is positive with Enterococcus faecalis. Please send Rx for Cipro 500 mg tablets. Take 1 tablet once daily for three days.

## 2018-10-10 ENCOUNTER — Telehealth: Payer: Self-pay | Admitting: *Deleted

## 2018-10-10 NOTE — Telephone Encounter (Signed)
Larene Beach, RN with [facility] Left VM at triage requesting verbal order to extend [facility]. Nursing visits for 2 more weeks since pt was just diagnoses with UTI. Also they are requesting an order for U/A and Cx. next week to make sure her UTI has resolved.  Larene Beach CB# [phone]

## 2018-10-10 NOTE — Telephone Encounter (Signed)
Approved.  

## 2018-10-11 NOTE — Telephone Encounter (Signed)
Gave the approval for the verbal orders 

## 2018-10-11 NOTE — Telephone Encounter (Signed)
Message left for Larene Beach to return my call.

## 2018-10-15 ENCOUNTER — Telehealth: Payer: Self-pay | Admitting: *Deleted

## 2018-10-15 NOTE — Telephone Encounter (Signed)
Kat OT with [facility] left a voicemail requesting verbal orders for treatment/OT for two times a week for 2 weeks, one time a week for 2 weeks Okay to leave verbal order on voicemail when you call back, it is secure.

## 2018-10-16 NOTE — Telephone Encounter (Signed)
Gave the approval for the orders. 

## 2018-10-16 NOTE — Telephone Encounter (Signed)
Approved.  

## 2018-10-18 DIAGNOSIS — F329 Major depressive disorder, single episode, unspecified: Secondary | ICD-10-CM | POA: Diagnosis not present

## 2018-10-18 DIAGNOSIS — M810 Age-related osteoporosis without current pathological fracture: Secondary | ICD-10-CM

## 2018-10-18 DIAGNOSIS — E782 Mixed hyperlipidemia: Secondary | ICD-10-CM

## 2018-10-18 DIAGNOSIS — G301 Alzheimer's disease with late onset: Secondary | ICD-10-CM

## 2018-10-18 DIAGNOSIS — Z9181 History of falling: Secondary | ICD-10-CM

## 2018-10-18 DIAGNOSIS — F0281 Dementia in other diseases classified elsewhere with behavioral disturbance: Secondary | ICD-10-CM

## 2018-11-03 ENCOUNTER — Other Ambulatory Visit: Payer: Self-pay | Admitting: Primary Care

## 2018-11-03 ENCOUNTER — Other Ambulatory Visit (HOSPITAL_COMMUNITY)
Admission: RE | Admit: 2018-11-03 | Discharge: 2018-11-03 | Disposition: A | Discharge status: Home / Self Care | Payer: Medicare Other | Source: Ambulatory Visit | Attending: Family Medicine | Admitting: Family Medicine

## 2018-11-03 DIAGNOSIS — N390 Urinary tract infection, site not specified: Secondary | ICD-10-CM | POA: Diagnosis present

## 2018-11-03 DIAGNOSIS — K219 Gastro-esophageal reflux disease without esophagitis: Secondary | ICD-10-CM

## 2018-11-03 LAB — URINALYSIS, ROUTINE W REFLEX MICROSCOPIC
Glucose, UA: NEGATIVE mg/dL
Hgb urine dipstick: NEGATIVE
Ketones, ur: NEGATIVE mg/dL
Leukocytes,Ua: NEGATIVE
Nitrite: NEGATIVE
Protein, ur: NEGATIVE mg/dL
Specific Gravity, Urine: 1.023 (ref 1.005–1.030)
pH: 6 (ref 5.0–8.0)

## 2018-11-04 LAB — URINE CULTURE: Culture: NO GROWTH

## 2018-11-05 ENCOUNTER — Telehealth: Payer: Self-pay

## 2018-11-05 NOTE — Telephone Encounter (Signed)
[place] Night - Client Nonclinical Telephone Record [facility] Client [place] Night - Client Client Site [place] - Night Physician Alma Friendly - NP Contact Type Call Call Maxwell Page Now Who Is [place] / [place] Name [facility] Name [place] Number [phone] Patient Name Rachel Garza Patient DOB [birth year] Reason for Call Symptomatic Patient Initial Comment Caller states her name is Larene Beach from [place] calling regarding a mutual patient who is having urinary symptoms and she is needing an order for a urinalysis. Additional Comment Paging DoctorName Phone DateTime Result/Outcome Message Type Notes Eliezer Lofts - MD [phone] 11/02/2018 4:10:18 PM Paged On Call Back to Call Center Doctor Paged Please call your answering service at [phone]. Eliezer Lofts - MD 11/02/2018 4:14:34 PM Spoke with On Call - General Message Result Call Closed By: Sharlet Salina Transaction Date/Time: 11/02/2018 3:44:00 PM (ET)

## 2018-11-05 NOTE — Telephone Encounter (Signed)
[place] Night - Client TELEPHONE ADVICE RECORD [facility] Patient Name: Rachel Garza Gender: Unknown DOB: [birth year] Age: 78 Y 10 M 28 D Return Phone Number: [phone] (Primary) Address: City/State/Zip: [place] Client [place] Night - Client Client Site [place] Physician Alma Friendly - NP Contact Type Call Who Is Calling Patient / Member / Family / Caregiver Call Type Triage / Clinical Caller Name Larene Beach Relationship To Patient Other Return Phone Number [phone] (Primary) Chief Complaint Unclassified Symptom Reason for Call Symptomatic / Request for Health Information Initial Comment shannon w/ [facility], needs to get urinalysis orders on pt. Translation No Nurse Assessment Guidelines Guideline Title Affirmed Question Affirmed Notes Nurse Date/Time (Eastern Time) Disp. Time Eilene Ghazi Time) Disposition Final User 11/02/2018 2:57:46 PM Attempt made - message left Criselda Peaches 11/02/2018 3:11:45 PM FINAL ATTEMPT MADE - message left Yes Juleen China, RN, Butch Penny Comments User: Corey Harold Date/Time Eilene Ghazi Time): 11/02/2018 1:02:35 PM Was in paging, Told to send to triage

## 2018-11-05 NOTE — Telephone Encounter (Signed)
Ok for [facility] nursing 1 x week for 4 weeks.

## 2018-11-05 NOTE — Telephone Encounter (Signed)
[place] from [place] left v/m that still waiting on results from urine; Larene Beach requesting verbal order for [facility] nursing to continue 1 x a wk for 4 wks.Please advise.

## 2018-11-05 NOTE — Telephone Encounter (Signed)
See 11/03/18 OP Visit.

## 2018-11-06 MED ORDER — ESOMEPRAZOLE MAGNESIUM 40 MG PO CPDR: 40.0000 mg | DELAYED_RELEASE_CAPSULE | Freq: Every day | ORAL | 1 refills | Status: DC

## 2018-11-06 NOTE — Telephone Encounter (Signed)
VO given.

## 2018-11-12 ENCOUNTER — Telehealth: Payer: Self-pay | Admitting: Primary Care

## 2018-11-12 NOTE — Telephone Encounter (Signed)
Please notify patient and her daughter that the urine specimen and culture from 11/03/18 was negative for infection.

## 2018-11-13 NOTE — Telephone Encounter (Signed)
Message left for patient's daughter to return my call.

## 2018-11-14 NOTE — Telephone Encounter (Signed)
Message left for patient to return my call.  

## 2018-11-15 NOTE — Telephone Encounter (Signed)
Sending letter with results and Kate Clark's comments for patient.  

## 2018-11-20 ENCOUNTER — Emergency Department (HOSPITAL_COMMUNITY): Payer: Medicare Other

## 2018-11-20 ENCOUNTER — Encounter (HOSPITAL_COMMUNITY): Payer: Self-pay | Admitting: Emergency Medicine

## 2018-11-20 ENCOUNTER — Other Ambulatory Visit: Payer: Self-pay

## 2018-11-20 ENCOUNTER — Other Ambulatory Visit: Payer: Self-pay | Admitting: Neurology

## 2018-11-20 ENCOUNTER — Emergency Department (HOSPITAL_COMMUNITY)
Admission: EM | Admit: 2018-11-20 | Discharge: 2018-11-20 | Discharge status: Against Medical Advice | Payer: Medicare Other | Attending: Emergency Medicine | Admitting: Emergency Medicine

## 2018-11-20 DIAGNOSIS — S0990XA Unspecified injury of head, initial encounter: Secondary | ICD-10-CM | POA: Diagnosis present

## 2018-11-20 DIAGNOSIS — W1830XA Fall on same level, unspecified, initial encounter: Secondary | ICD-10-CM | POA: Insufficient documentation

## 2018-11-20 DIAGNOSIS — Y999 Unspecified external cause status: Secondary | ICD-10-CM | POA: Diagnosis not present

## 2018-11-20 DIAGNOSIS — Y939 Activity, unspecified: Secondary | ICD-10-CM | POA: Diagnosis not present

## 2018-11-20 DIAGNOSIS — Z5321 Procedure and treatment not carried out due to patient leaving prior to being seen by health care provider: Secondary | ICD-10-CM | POA: Diagnosis not present

## 2018-11-20 DIAGNOSIS — Y929 Unspecified place or not applicable: Secondary | ICD-10-CM | POA: Diagnosis not present

## 2018-11-20 MED ORDER — OLANZAPINE 5 MG PO TABS: ORAL_TABLET | ORAL | 3 refills | Status: DC

## 2018-11-20 NOTE — ED Triage Notes (Signed)
Family states that patient has fallen twice today and hit head both times. Denies LOC. Pt has dementia.

## 2018-11-20 NOTE — ED Notes (Signed)
Got verbal orders from [place] for Ct scans, while waiting for room.

## 2018-11-28 ENCOUNTER — Telehealth: Payer: Self-pay | Admitting: *Deleted

## 2018-11-28 NOTE — Telephone Encounter (Signed)
Received a VM at [place] from [employer] with [facility]. She said pt is still dealing with weakness and balance issues since her last fall. Daughter is requesting more PT for pt. Larene Beach said if PCP approves her to have PT they would need an order/referral for PT faxed to them at fax #  [phone]   Larene Beach said if we need to speak with her then her CB # is [phone]

## 2018-11-29 NOTE — Telephone Encounter (Signed)
Ok to give verbal order for PT. They can fax order for [place] to sign.

## 2018-12-02 NOTE — Telephone Encounter (Addendum)
Message left for Rachel Garza to return my call.

## 2018-12-03 ENCOUNTER — Ambulatory Visit (INDEPENDENT_AMBULATORY_CARE_PROVIDER_SITE_OTHER): Payer: Medicare Other | Admitting: Primary Care

## 2018-12-03 ENCOUNTER — Encounter: Payer: Self-pay | Admitting: Primary Care

## 2018-12-03 ENCOUNTER — Other Ambulatory Visit: Payer: Self-pay

## 2018-12-03 VITALS — BP 122/84 | HR 74 | Temp 97.4°F | Ht 62.5 in | Wt 181.5 lb

## 2018-12-03 DIAGNOSIS — R739 Hyperglycemia, unspecified: Secondary | ICD-10-CM | POA: Diagnosis not present

## 2018-12-03 DIAGNOSIS — R600 Localized edema: Secondary | ICD-10-CM | POA: Diagnosis not present

## 2018-12-03 DIAGNOSIS — Z23 Encounter for immunization: Secondary | ICD-10-CM | POA: Diagnosis not present

## 2018-12-03 DIAGNOSIS — F02818 Dementia in other diseases classified elsewhere, unspecified severity, with other behavioral disturbance: Secondary | ICD-10-CM

## 2018-12-03 DIAGNOSIS — E785 Hyperlipidemia, unspecified: Secondary | ICD-10-CM

## 2018-12-03 DIAGNOSIS — F0281 Dementia in other diseases classified elsewhere with behavioral disturbance: Secondary | ICD-10-CM

## 2018-12-03 DIAGNOSIS — G301 Alzheimer's disease with late onset: Secondary | ICD-10-CM

## 2018-12-03 LAB — COMPREHENSIVE METABOLIC PANEL
ALT: 19 U/L (ref 0–35)
AST: 28 U/L (ref 0–37)
Albumin: 3.8 g/dL (ref 3.5–5.2)
Alkaline Phosphatase: 97 U/L (ref 39–117)
BUN: 12 mg/dL (ref 6–23)
CO2: 32 mEq/L (ref 19–32)
Calcium: 9.4 mg/dL (ref 8.4–10.5)
Chloride: 106 mEq/L (ref 96–112)
Creatinine, Ser: 0.89 mg/dL (ref 0.40–1.20)
GFR: 61.5 mL/min (ref >60.00)
Glucose, Bld: 105 mg/dL — ABNORMAL HIGH (ref 70–99)
Potassium: 4.2 mEq/L (ref 3.5–5.1)
Sodium: 144 mEq/L (ref 135–145)
Total Bilirubin: 0.4 mg/dL (ref 0.2–1.2)
Total Protein: 6.2 g/dL (ref 6.0–8.3)

## 2018-12-03 LAB — CBC
HCT: 43.1 % (ref 36.0–46.0)
Hemoglobin: 14.2 g/dL (ref 12.0–15.0)
MCHC: 32.9 g/dL (ref 30.0–36.0)
MCV: 97.3 fl (ref 78.0–100.0)
Platelets: 256 10*3/uL (ref 150.0–400.0)
RBC: 4.43 Mil/uL (ref 3.87–5.11)
RDW: 14.6 % (ref 11.5–15.5)
WBC: 5.9 10*3/uL (ref 4.0–10.5)

## 2018-12-03 LAB — BRAIN NATRIURETIC PEPTIDE: Pro B Natriuretic peptide (BNP): 64 pg/mL (ref 0.0–100.0)

## 2018-12-03 LAB — LIPID PANEL
Cholesterol: 191 mg/dL (ref 0–200)
HDL: 60.7 mg/dL (ref >39.00)
LDL Cholesterol: 104 mg/dL — ABNORMAL HIGH (ref 0–99)
NonHDL: 130.43
Total CHOL/HDL Ratio: 3
Triglycerides: 130 mg/dL (ref 0.0–149.0)
VLDL: 26 mg/dL (ref 0.0–40.0)

## 2018-12-03 LAB — HEMOGLOBIN A1C: Hgb A1c MFr Bld: 5.7 % (ref 4.6–6.5)

## 2018-12-03 NOTE — Assessment & Plan Note (Signed)
Mild on exam today. Likely secondary to sedentary lifestyle in dependant position.  Check labs today to rule out other causes including BNP, CMP, CBC, A1C.   Encouraged elevation of extremities, ambulation, reduced salt intake, proper water intake, compression socks.

## 2018-12-03 NOTE — Assessment & Plan Note (Signed)
Repeat lipids pending. Continue simvastatin. 

## 2018-12-03 NOTE — Assessment & Plan Note (Signed)
Increased aggression towards family members. Following with neurology who recently increased dose of Zyprexa. Continue current regimen.

## 2018-12-03 NOTE — Patient Instructions (Signed)
Stop by the lab prior to leaving today. I will notify you of your results once received.   Elevate your legs when sitting to prevent swelling.  Consider wearing compression socks during the day to help push blood and other fluid back up the legs.  Try to get up out of the chair for 15 minutes every hour.   Schedule a nurse visit for 2-6 months from today for the second shingles vaccination.  It was a pleasure to see you today!

## 2018-12-03 NOTE — Progress Notes (Signed)
Subjective:    Patient ID: Rachel Garza, female    DOB: 02/12/1941, 78 y.o.   MRN: [id number]  HPI  Ms. Vanduzer is a 78 year old female with a history of dementia, hyperlipidemia, GERD who presents today with her family for abdominal swelling.  Her daughter and husband are with her today who are providing information for HPI.   Her family was notified by the home health aid that the patient has intermittent swelling to different parts of her body including lower extremities, knees. Clothing will fit tighter on some days. She sits in her chair at home all day, hardly gets up except to use the bathroom. She has compression socks but refuses to wear them.   Daughter denies cough, increased SOB. She has gained weight due to sedentary lifestyle.   Wt Readings from Last 3 Encounters:  12/03/18 181 lb 8 oz (82.3 kg)  09/24/18 181 lb 8 oz (82.3 kg)  04/10/18 171 lb (77.6 kg)     Review of Systems  Respiratory: Negative for cough and shortness of breath.   Cardiovascular: Positive for leg swelling. Negative for chest pain.  Neurological:       Poor balance, working with PT.  Psychiatric/Behavioral: Positive for agitation.       Past Medical History:  Diagnosis Date  . Dementia (HCC)   . Hepatitis   . Hyperlipidemia   . Osteopenia      Social History   Socioeconomic History  . Marital status: Married    Spouse name: Not on file  . Number of children: 3  . Years of education: 44  . Highest education level: Not on file  Occupational History  . Occupation: Retired  Engineer, production  . Financial resource strain: Not on file  . Food insecurity    Worry: Not on file    Inability: Not on file  . Transportation needs    Medical: Not on file    Non-medical: Not on file  Tobacco Use  . Smoking status: Never Smoker  . Smokeless tobacco: Never Used  Substance and Sexual Activity  . Alcohol use: No    Alcohol/week: 0.0 standard drinks  . Drug use: No  . Sexual activity: Not on  file  Lifestyle  . Physical activity    Days per week: Not on file    Minutes per session: Not on file  . Stress: Not on file  Relationships  . Social Musician on phone: Not on file    Gets together: Not on file    Attends religious service: Not on file    Active member of club or organization: Not on file    Attends meetings of clubs or organizations: Not on file    Relationship status: Not on file  . Intimate partner violence    Fear of current or ex partner: Not on file    Emotionally abused: Not on file    Physically abused: Not on file    Forced sexual activity: Not on file  Other Topics Concern  . Not on file  Social History Narrative   Married.   3 children. 1 grandchild.   Retired. Once worked as a Youth worker.   Enjoys gardening, spending time with family.   Right-handed   Caffeine: 2-3 cups coffee per day    Past Surgical History:  Procedure Laterality Date  . UMBILICAL HERNIA REPAIR    . VAGINAL HYSTERECTOMY      Family History  Problem Relation Age of Onset  . Coronary artery disease Mother   . Coronary artery disease Father   . Diabetes Mellitus I Sister   . Cancer Sister   . Glaucoma Sister   . Breast cancer Maternal Aunt     Allergies  Allergen Reactions  . Niacin And Related     Current Outpatient Medications on File Prior to Visit  Medication Sig Dispense Refill  . alendronate (FOSAMAX) 70 MG tablet TAKE 1 TABLET BY MOUTH ONCE EVERY 7 DAYS WITH A FULL GLASS OF WATER ON AN EMPTY STOMACH 12 tablet 1  . aspirin 81 MG tablet Take 81 mg by mouth daily after breakfast.     . BIOTIN 5000 PO Take 5,000 mcg by mouth daily after breakfast.     . citalopram (CELEXA) 40 MG tablet Take 1 tablet (40 mg total) by mouth daily. 90 tablet 1  . donepezil (ARICEPT) 23 MG TABS tablet Take 1 tablet (23 mg total) by mouth at bedtime. 90 tablet 3  . esomeprazole (NEXIUM) 40 MG capsule Take 1 capsule (40 mg total) by mouth daily. For heartburn. 90  capsule 1  . hydrOXYzine (ATARAX/VISTARIL) 10 MG tablet TAKE 1 TABLET BY MOUTH AS NEEDED FOR ANXIETY. MAY CAUSE DROWSINESS. 90 tablet 1  . memantine (NAMENDA) 10 MG tablet Take 1 tablet (10 mg total) by mouth 2 (two) times daily. 180 tablet 3  . Multiple Vitamins-Minerals (MULTIVITAMIN PO) Take 1 tablet by mouth daily after breakfast.     . OLANZapine (ZYPREXA) 5 MG tablet One tablet in the morning and late afternoon, 2 in the evening 120 tablet 3  . Omega-3 Fatty Acids (FISH OIL PO) Take 1 tablet by mouth daily after breakfast.     . simvastatin (ZOCOR) 20 MG tablet TAKE 1 TABLET BY MOUTH EVERY DAY IN THE EVENING FOR CHOLESTEROL 90 tablet 0   No current facility-administered medications on file prior to visit.     BP 122/84   Pulse 74   Temp (!) 97.4 F (36.3 C) (Temporal)   Ht 5' 2.5" (1.588 m)   Wt 181 lb 8 oz (82.3 kg)   SpO2 95%   BMI 32.67 kg/m    Objective:   Physical Exam  Constitutional: She appears well-nourished.  Neck: Neck supple.  Cardiovascular: Normal rate and regular rhythm.  Pulses:      Dorsalis pedis pulses are 2+ on the right side and 2+ on the left side.       Posterior tibial pulses are 2+ on the right side and 2+ on the left side.  Mild edema noted to bilateral lower extremities specifically around sock line.  Respiratory: Effort normal and breath sounds normal.  No crackles   GI: Soft.  No ascites   Neurological: She is alert.  Follows most commands  Skin: Skin is warm and dry.  Psychiatric: She has a normal mood and affect.           Assessment & Plan:

## 2018-12-04 NOTE — Telephone Encounter (Signed)
Message left for Shannon to return my call.  

## 2018-12-04 NOTE — Addendum Note (Signed)
Addended by: Jacqualin Combes on: 12/04/2018 08:56 AM   Modules accepted: Orders

## 2018-12-13 ENCOUNTER — Other Ambulatory Visit: Payer: Self-pay | Admitting: Neurology

## 2018-12-20 ENCOUNTER — Other Ambulatory Visit: Payer: Self-pay | Admitting: Primary Care

## 2018-12-20 DIAGNOSIS — E782 Mixed hyperlipidemia: Secondary | ICD-10-CM

## 2018-12-21 ENCOUNTER — Other Ambulatory Visit: Payer: Self-pay | Admitting: Neurology

## 2019-01-28 ENCOUNTER — Telehealth: Payer: Self-pay | Admitting: Primary Care

## 2019-01-28 NOTE — Telephone Encounter (Signed)
Noted, will provide letter during visit.

## 2019-01-28 NOTE — Telephone Encounter (Signed)
Pt's husband and pt came into office and stated he needs a letter from Anda Kraft stating his wife has Alzheimer's. He scheduled an appt on 01/31/19 with Anda Kraft b/c she is having really bad tremors and he wants to get the letter at this appointment. He said if you have any questions call him.  CB [phone] Josephina Shih

## 2019-01-31 ENCOUNTER — Ambulatory Visit (INDEPENDENT_AMBULATORY_CARE_PROVIDER_SITE_OTHER): Payer: Medicare Other | Admitting: Primary Care

## 2019-01-31 ENCOUNTER — Other Ambulatory Visit: Payer: Self-pay

## 2019-01-31 ENCOUNTER — Encounter: Payer: Self-pay | Admitting: Primary Care

## 2019-01-31 DIAGNOSIS — G301 Alzheimer's disease with late onset: Secondary | ICD-10-CM | POA: Diagnosis not present

## 2019-01-31 DIAGNOSIS — F0281 Dementia in other diseases classified elsewhere with behavioral disturbance: Secondary | ICD-10-CM

## 2019-01-31 DIAGNOSIS — R251 Tremor, unspecified: Secondary | ICD-10-CM | POA: Insufficient documentation

## 2019-01-31 DIAGNOSIS — F02818 Dementia in other diseases classified elsewhere, unspecified severity, with other behavioral disturbance: Secondary | ICD-10-CM

## 2019-01-31 LAB — TSH: TSH: 2.91 u[IU]/mL (ref 0.35–4.50)

## 2019-01-31 NOTE — Progress Notes (Signed)
Subjective:    Patient ID: Rachel Garza, female    DOB: Feb 03, 1941, 77 y.o.   MRN: [id number]  HPI  [name] is a 78 year old female with a history of dementia with behavorial disturbance, sundowning, hallucinations, depression, hyperlipidemia, lower extremity edema who presents today with her husband for a chief compliant of tremors. Her husband is also requesting a letter. She does not contribute to the HPI.  Her husband endorses chronic tremors over the last 4-5 years that have always been mild. Over the last several months her husband has noticed increased tremor to her hands bilaterally with and without anxiety. The "bad" tremors occur 2-3 times weekly lasting 2-3 hours. This does not prevent her from eating. Her husband thinks that her father had a history of parkinson's disease.    Her husband is requesting a letter today stating that the patient has Alzheimer's Disease and is unable to make medical decisions for herself.    Review of Systems  Respiratory: Negative for shortness of breath.   Neurological: Positive for tremors.  Psychiatric/Behavioral:       Husband denies changes in mental status       Past Medical History:  Diagnosis Date  . Dementia (HCC)   . Hepatitis   . Hyperlipidemia   . Osteopenia      Social History   Socioeconomic History  . Marital status: Married    Spouse name: Not on file  . Number of children: 3  . Years of education: 55  . Highest education level: Not on file  Occupational History  . Occupation: Retired  Engineer, production  . Financial resource strain: Not on file  . Food insecurity    Worry: Not on file    Inability: Not on file  . Transportation needs    Medical: Not on file    Non-medical: Not on file  Tobacco Use  . Smoking status: Never Smoker  . Smokeless tobacco: Never Used  Substance and Sexual Activity  . Alcohol use: No    Alcohol/week: 0.0 standard drinks  . Drug use: No  . Sexual activity: Not on file  Lifestyle   . Physical activity    Days per week: Not on file    Minutes per session: Not on file  . Stress: Not on file  Relationships  . Social Musician on phone: Not on file    Gets together: Not on file    Attends religious service: Not on file    Active member of club or organization: Not on file    Attends meetings of clubs or organizations: Not on file    Relationship status: Not on file  . Intimate partner violence    Fear of current or ex partner: Not on file    Emotionally abused: Not on file    Physically abused: Not on file    Forced sexual activity: Not on file  Other Topics Concern  . Not on file  Social History Narrative   Married.   3 children. 1 grandchild.   Retired. Once worked as a Youth worker.   Enjoys gardening, spending time with family.   Right-handed   Caffeine: 2-3 cups coffee per day    Past Surgical History:  Procedure Laterality Date  . UMBILICAL HERNIA REPAIR    . VAGINAL HYSTERECTOMY      Family History  Problem Relation Age of Onset  . Coronary artery disease Mother   . Coronary artery disease  Father   . Diabetes Mellitus I Sister   . Cancer Sister   . Glaucoma Sister   . Breast cancer Maternal Aunt     Allergies  Allergen Reactions  . Niacin And Related     Current Outpatient Medications on File Prior to Visit  Medication Sig Dispense Refill  . alendronate (FOSAMAX) 70 MG tablet TAKE 1 TABLET BY MOUTH ONCE EVERY 7 DAYS WITH A FULL GLASS OF WATER ON AN EMPTY STOMACH 12 tablet 1  . aspirin 81 MG tablet Take 81 mg by mouth daily after breakfast.     . BIOTIN 5000 PO Take 5,000 mcg by mouth daily after breakfast.     . citalopram (CELEXA) 40 MG tablet Take 1 tablet (40 mg total) by mouth daily. 90 tablet 1  . donepezil (ARICEPT) 23 MG TABS tablet Take 1 tablet (23 mg total) by mouth at bedtime. 90 tablet 3  . esomeprazole (NEXIUM) 40 MG capsule Take 1 capsule (40 mg total) by mouth daily. For heartburn. 90 capsule 1  .  hydrOXYzine (ATARAX/VISTARIL) 10 MG tablet TAKE 1 TABLET BY MOUTH AS NEEDED FOR ANXIETY. MAY CAUSE DROWSINESS. 90 tablet 1  . memantine (NAMENDA) 10 MG tablet Take 1 tablet (10 mg total) by mouth 2 (two) times daily. 180 tablet 3  . Multiple Vitamins-Minerals (MULTIVITAMIN PO) Take 1 tablet by mouth daily after breakfast.     . OLANZapine (ZYPREXA) 5 MG tablet TAKE ONE TABLET IN THE MORNING AND LATE AFTERNOON, 2 IN THE EVENING 360 tablet 2  . Omega-3 Fatty Acids (FISH OIL PO) Take 1 tablet by mouth daily after breakfast.     . simvastatin (ZOCOR) 20 MG tablet TAKE 1 TABLET BY MOUTH EVERY DAY IN THE EVENING FOR CHOLESTEROL 90 tablet 2   No current facility-administered medications on file prior to visit.     BP 120/74   Pulse 82   Temp 97.9 F (36.6 C) (Temporal)   Ht 5' 2.5" (1.588 m)   Wt 190 lb 4 oz (86.3 kg)   SpO2 98%   BMI 34.24 kg/m    Objective:   Physical Exam  Constitutional: She appears well-nourished.  Neck: Neck supple.  Cardiovascular: Normal rate and regular rhythm.  Respiratory: Effort normal and breath sounds normal.  Neurological: She is alert.  Mild resting tremor noted to bilateral hands, right more than left. Mild resting tremor to bilateral feet.  Skin: Skin is warm and dry.  Psychiatric:  Not contributory to HPI           Assessment & Plan:

## 2019-01-31 NOTE — Patient Instructions (Addendum)
I will be in touch once I hear back from the neurologist.  Stop by the lab prior to leaving today. I will notify you of your results once received.   It was a pleasure to see you today!

## 2019-01-31 NOTE — Assessment & Plan Note (Addendum)
Chronic, increased over the last 2-3 months.  Consider citalopram 40 mg to be contributing. Doesn't appear to be Parkinson's disease but will consult with her neurologist.  Start with neurology consultation, perhaps with reduction in citalopram to 20 mg. Would rather reduce citalopram to see how she does instead of adding another medication to her regimen for tremor.  Also check TSH. Other labs from October 2020 without obvious cause for symptoms.  Discussed with patients husband, he agrees. We will be in touch as soon as I hear back.

## 2019-01-31 NOTE — Assessment & Plan Note (Addendum)
Following with neurology and overall doing well. Question whether citalopram 40 mg may be contributing to increased tremors. Will contact neurology and discuss.   Letter provided to husband as requested.

## 2019-02-02 ENCOUNTER — Other Ambulatory Visit: Payer: Self-pay | Admitting: Primary Care

## 2019-02-02 ENCOUNTER — Telehealth: Payer: Self-pay | Admitting: Primary Care

## 2019-02-02 DIAGNOSIS — K219 Gastro-esophageal reflux disease without esophagitis: Secondary | ICD-10-CM

## 2019-02-02 NOTE — Telephone Encounter (Signed)
Rachel Garza, please call patient's husband and notify them that I heard back from Dr. Jannifer Franklin who recommends further testing for secondary parkinson's disease. They need to get in touch with his office to discuss and for evaluation. Please have them call for an appointment.

## 2019-02-02 NOTE — Telephone Encounter (Signed)
-----   Message from Kathrynn Ducking, MD sent at 02/01/2019 10:24 AM EST ----- If the patient has true resting tremors, it may be related to Zyprexa which was recently increased, we will need to get a revisit to look for evidence of secondary parkinsonism. ----- Message ----- From: Pleas Koch, NP Sent: 01/31/2019  12:54 PM EST To: Kathrynn Ducking, MD  Good afternoon!  This patient of ours presented today with her husband for increased resting tremors to bilateral hands. I'm not sure if you're able to view my notes in Epic.  Would it be reasonable to decrease her citalopram to 20 mg given potential side effects of tremors? I wanted to touch base with you as I'd rather try to limit potential side effects of another medication before adding something else for tremor control.  I appreciate your time! Allie Bossier, NP-C [place]

## 2019-02-03 ENCOUNTER — Telehealth: Payer: Self-pay

## 2019-02-03 NOTE — Telephone Encounter (Signed)
-----   Message from Kathrynn Ducking, MD sent at 02/01/2019 10:25 AM EST ----- This patient has recently developed resting tremors on Zyprexa, need a revisit sometime in the next several weeks to evaluate.  Thank you.

## 2019-02-04 MED ORDER — OMEPRAZOLE 40 MG PO CPDR: 40.0000 mg | DELAYED_RELEASE_CAPSULE | Freq: Every day | ORAL | 1 refills | Status: DC

## 2019-02-04 NOTE — Telephone Encounter (Signed)
I called patient regarding scheduling an appointment with Dr. Jannifer Franklin (02/19/19). LVM requesting she call back.

## 2019-02-04 NOTE — Telephone Encounter (Signed)
For new symptoms this patient would need to see Dr. Jannifer Franklin, right? I believe the phone room scheduled this patient a follow-up with Judson Roch for 12/15 today.

## 2019-02-04 NOTE — Telephone Encounter (Signed)
Spoken and notified patient's husband of Rachel Garza comments. Patient's hasband verbalized understanding.

## 2019-02-04 NOTE — Telephone Encounter (Signed)
Yes, this would need to be with Dr. Jannifer Franklin. We could open up the 12 pm on 02/19/2019 if the pt is agreeable.

## 2019-02-05 ENCOUNTER — Ambulatory Visit (INDEPENDENT_AMBULATORY_CARE_PROVIDER_SITE_OTHER): Payer: Medicare Other | Admitting: *Deleted

## 2019-02-05 ENCOUNTER — Other Ambulatory Visit: Payer: Self-pay

## 2019-02-05 DIAGNOSIS — Z23 Encounter for immunization: Secondary | ICD-10-CM | POA: Diagnosis not present

## 2019-02-05 NOTE — Progress Notes (Signed)
Per orders of Alma Friendly, NP, injection of Shingrix (#2 dose) given by Virl Cagey. Waiver form signed since patient has Medicare. Patient tolerated injection well.

## 2019-02-11 ENCOUNTER — Ambulatory Visit: Payer: Medicare Other | Admitting: Neurology

## 2019-02-19 ENCOUNTER — Telehealth: Payer: Self-pay

## 2019-02-19 ENCOUNTER — Ambulatory Visit: Payer: Medicare Other | Admitting: Neurology

## 2019-02-19 ENCOUNTER — Encounter

## 2019-02-19 NOTE — Telephone Encounter (Signed)
PT no show for appt. Per appointment note husband stated pt is refusing to attend to appt.

## 2019-03-16 ENCOUNTER — Other Ambulatory Visit: Payer: Self-pay | Admitting: Neurology

## 2019-03-17 ENCOUNTER — Emergency Department (HOSPITAL_COMMUNITY)
Admission: EM | Admit: 2019-03-17 | Discharge: 2019-03-17 | Disposition: A | Discharge status: Home / Self Care | Payer: Medicare Other | Attending: Emergency Medicine | Admitting: Emergency Medicine

## 2019-03-17 ENCOUNTER — Encounter (HOSPITAL_COMMUNITY): Payer: Self-pay | Admitting: Emergency Medicine

## 2019-03-17 ENCOUNTER — Telehealth: Payer: Self-pay | Admitting: Neurology

## 2019-03-17 ENCOUNTER — Other Ambulatory Visit: Payer: Self-pay

## 2019-03-17 DIAGNOSIS — R451 Restlessness and agitation: Secondary | ICD-10-CM | POA: Insufficient documentation

## 2019-03-17 DIAGNOSIS — Z5321 Procedure and treatment not carried out due to patient leaving prior to being seen by health care provider: Secondary | ICD-10-CM | POA: Diagnosis not present

## 2019-03-17 NOTE — Telephone Encounter (Signed)
I tried to call several times, the line is busy, unable to reach the patient's husband.  I will call back tomorrow.

## 2019-03-17 NOTE — ED Triage Notes (Signed)
Pt's husband states that for the past week patient has become more agitated at home and become aggressive with him and hitting him. Reports they went to neurologist office before come here and was told to go to ED.  Pt has dementia.

## 2019-03-17 NOTE — Telephone Encounter (Signed)
Pt's husband stopped by wanting advice for his wife that is "going crazy". He claims she is hitting him and yelling at him and not in the right state of mind. Pt stated he was taking her to the hospital but would like for a nurse to reach out to him. Please advise.

## 2019-03-18 ENCOUNTER — Other Ambulatory Visit: Payer: Self-pay | Admitting: Primary Care

## 2019-03-18 DIAGNOSIS — F0391 Unspecified dementia with behavioral disturbance: Secondary | ICD-10-CM

## 2019-03-18 MED ORDER — DONEPEZIL HCL 10 MG PO TABS: 10.0000 mg | ORAL_TABLET | Freq: Every day | ORAL | 3 refills | Status: AC

## 2019-03-18 MED ORDER — DIVALPROEX SODIUM 250 MG PO DR TAB: 250.0000 mg | DELAYED_RELEASE_TABLET | Freq: Two times a day (BID) | ORAL | 3 refills | Status: DC

## 2019-03-18 NOTE — Telephone Encounter (Signed)
I called the patient and talk with her husband.  The patient is had ongoing problems with agitation, reactive aggression.  She is on maximum doses of the Zyprexa taking 20 mg daily, she is on 23 mg of Aricept, she takes 40 mg of Celexa and she is on Namenda.  Hydroxyzine was given to her from her primary care doctor, it is not clear that this is helpful.  She is to stop the hydroxyzine, we will reduce the dose of the Aricept to 10 mg at night and add Depakote 250 mg twice daily.

## 2019-03-18 NOTE — Telephone Encounter (Signed)
I called again, was able to leave a message, I will call back again later.

## 2019-03-18 NOTE — Addendum Note (Signed)
Addended by: York Spaniel on: 03/18/2019 02:48 PM   Modules accepted: Orders

## 2019-04-04 ENCOUNTER — Other Ambulatory Visit: Payer: Self-pay | Admitting: Primary Care

## 2019-04-04 DIAGNOSIS — M810 Age-related osteoporosis without current pathological fracture: Secondary | ICD-10-CM

## 2019-04-09 ENCOUNTER — Other Ambulatory Visit: Payer: Self-pay | Admitting: Neurology

## 2019-05-02 ENCOUNTER — Other Ambulatory Visit: Payer: Self-pay | Admitting: Primary Care

## 2019-05-02 DIAGNOSIS — K219 Gastro-esophageal reflux disease without esophagitis: Secondary | ICD-10-CM

## 2019-05-13 ENCOUNTER — Telehealth: Payer: Self-pay | Admitting: Neurology

## 2019-05-13 ENCOUNTER — Other Ambulatory Visit: Payer: Self-pay | Admitting: Neurology

## 2019-05-13 DIAGNOSIS — G301 Alzheimer's disease with late onset: Secondary | ICD-10-CM

## 2019-05-13 DIAGNOSIS — F0281 Dementia in other diseases classified elsewhere with behavioral disturbance: Secondary | ICD-10-CM

## 2019-05-13 NOTE — Telephone Encounter (Signed)
Rep with [facility] called to verify if Dr.Willis will be the attending physician for hospice.

## 2019-05-13 NOTE — Progress Notes (Signed)
Referral was made to hospice.

## 2019-05-26 NOTE — Telephone Encounter (Signed)
Dr. Anne Hahn sign the form to be attending MD for pt with hospice care. Form signed and fax back to [phone] twice and confirmed.

## 2019-06-09 ENCOUNTER — Other Ambulatory Visit: Payer: Self-pay | Admitting: Neurology

## 2019-06-09 ENCOUNTER — Other Ambulatory Visit: Payer: Self-pay | Admitting: Primary Care

## 2019-06-09 DIAGNOSIS — F0391 Unspecified dementia with behavioral disturbance: Secondary | ICD-10-CM

## 2019-06-12 ENCOUNTER — Telehealth: Payer: Self-pay | Admitting: Neurology

## 2019-06-12 NOTE — Telephone Encounter (Signed)
I tried to call back for hospice, the number given is not operational.  Hopefully they will call back later and give [place] an operational number.

## 2019-06-12 NOTE — Telephone Encounter (Signed)
Kristen,RN @ [facility] has called to ask about liquid forms of pt's medications since she is having difficulty swallowing pills.  Please call

## 2019-06-18 ENCOUNTER — Other Ambulatory Visit: Payer: Self-pay | Admitting: Neurology

## 2019-06-18 NOTE — Telephone Encounter (Signed)
kristen with [facility] called to advise pt is having a harder time swallowing and would like to know if her medications could be prescribed in liquid form.  Cb#[phone]

## 2019-06-18 NOTE — Telephone Encounter (Signed)
Baxter Hire called back stating that all of her medications can be crushed except the divalproex (DEPAKOTE) 250 MG DR tablet She would like to know if the Depakote can be prescribed as the sprinkles

## 2019-06-18 NOTE — Telephone Encounter (Signed)
I called Rachel Garza about if depakote can be in liquid form because of having a hard time swallowing. I advise her to call the pharmacy to see if all the medications can be crush for pt to take. She appreciate the call and will call us back for any concerns of questions.Her number is [phone].

## 2019-06-20 MED ORDER — VALPROATE SODIUM 250 MG/5ML PO SOLN: 250.0000 mg | Freq: Two times a day (BID) | ORAL | 3 refills | Status: AC

## 2019-06-20 NOTE — Telephone Encounter (Signed)
I will take the patient off of Depakote and switched to Depakene.  I called and left a message.

## 2019-06-20 NOTE — Addendum Note (Signed)
Addended by: York Spaniel on: 06/20/2019 01:28 PM   Modules accepted: Orders

## 2019-06-24 ENCOUNTER — Ambulatory Visit: Payer: Medicare Other | Admitting: Neurology

## 2019-08-11 ENCOUNTER — Encounter: Payer: Self-pay | Admitting: Primary Care

## 2019-10-07 ENCOUNTER — Telehealth: Payer: Self-pay | Admitting: *Deleted

## 2019-10-07 NOTE — Telephone Encounter (Signed)
Late entry from 09/29/19. Dr Anne Hahn signed plan of care/order form received from [facility]. Forms returned via fax to [facility]. Received a receipt of confirmation.

## 2019-11-24 NOTE — Telephone Encounter (Signed)
Placed signed ceritification statement form for 1st 90 day period up front for pick up.

## 2020-02-28 DEATH — deceased

## 2020-04-01 NOTE — Telephone Encounter (Signed)
Rachel Garza stopped in today to pick up signed plan of care from Dr. Anne Hahn.  Unable to locate from September.  He stated they didn't get the faxed copy from then.  He left another copy.  Dr. Anne Hahn has signed it.  Rachel Garza is stopped in on Friday, 04/02/20 to pick up.

## 2020-06-01 IMAGING — CT CT HEAD W/O CM
3 series · 15 of 47 positions shown, 18 images · non-contrast
Comparison: [DATE] [DATE], [DATE], [DATE] [DATE], [DATE]

CLINICAL DATA: 77-year-old female with a history of head trauma

EXAM:
CT HEAD WITHOUT CONTRAST
CT CERVICAL SPINE WITHOUT CONTRAST
TECHNIQUE: Multidetector CT imaging of the head and cervical spine was
performed following the standard protocol without intravenous
contrast. Multiplanar CT image reconstructions of the cervical spine
were also generated.

[Series 3: head wo · axial · 0.40mm/px · z∈[-88,+37]mm · 9 of 31 slices shown, 12 images]
[im 3/31  brain]
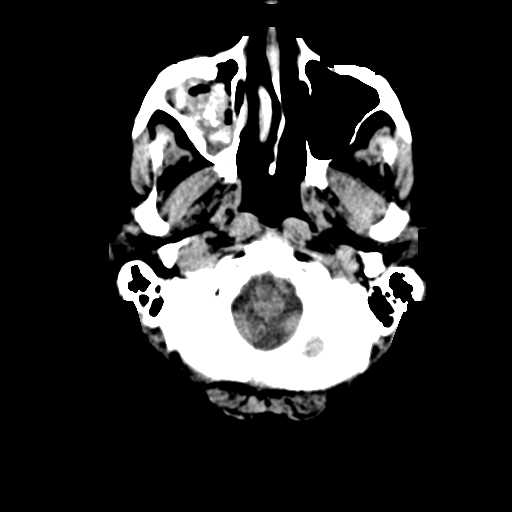
[im 3/31  bone]
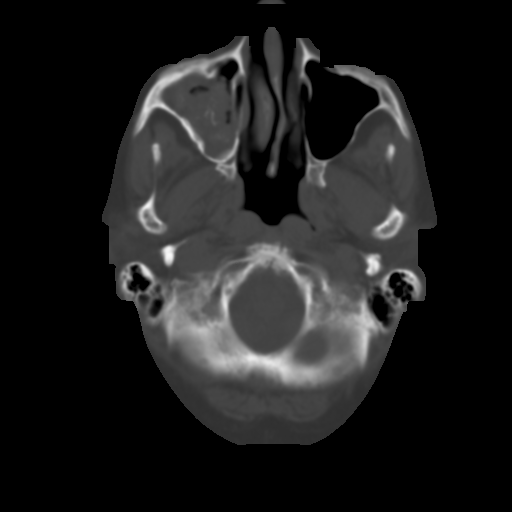
[im 6/31  brain]
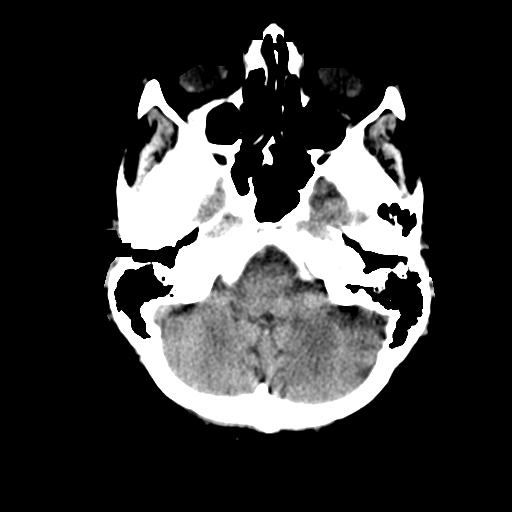
[im 9/31  brain]
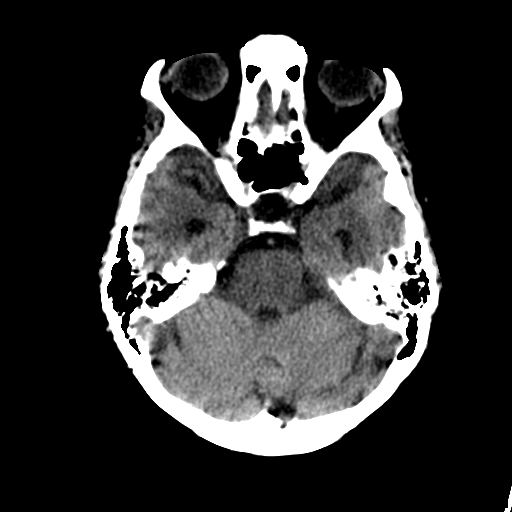
[im 12/31  brain]
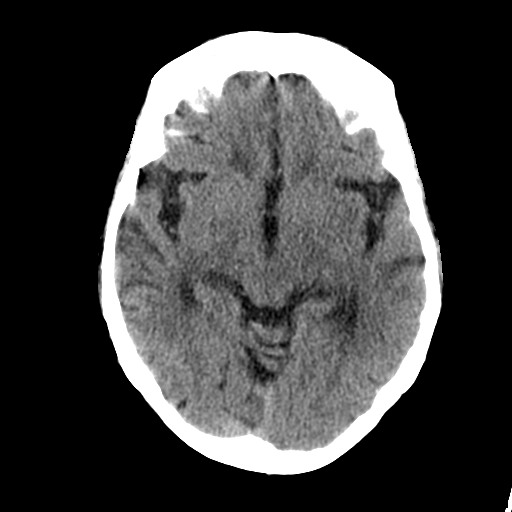
[im 16/31  brain]
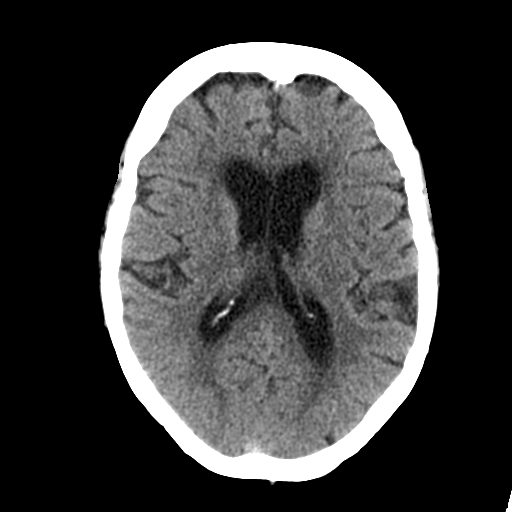
[im 16/31  bone]
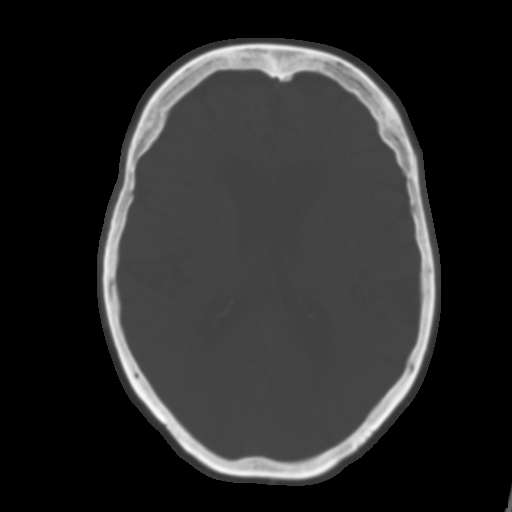
[im 19/31  brain]
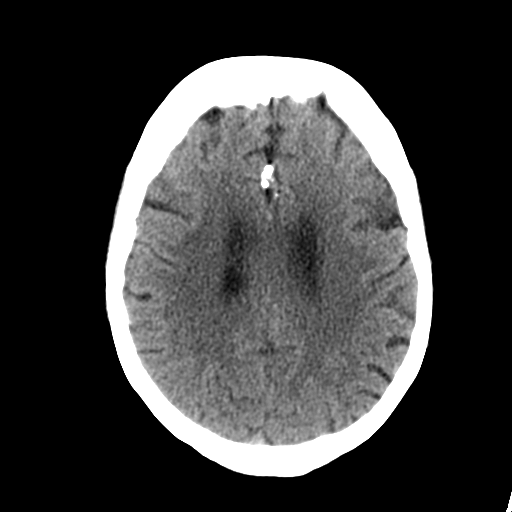
[im 22/31  brain]
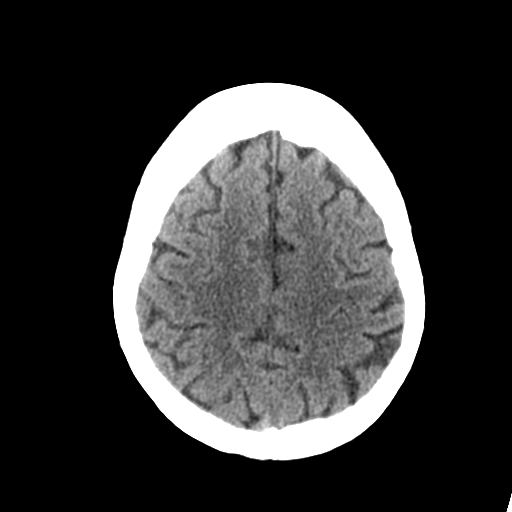
[im 25/31  brain]
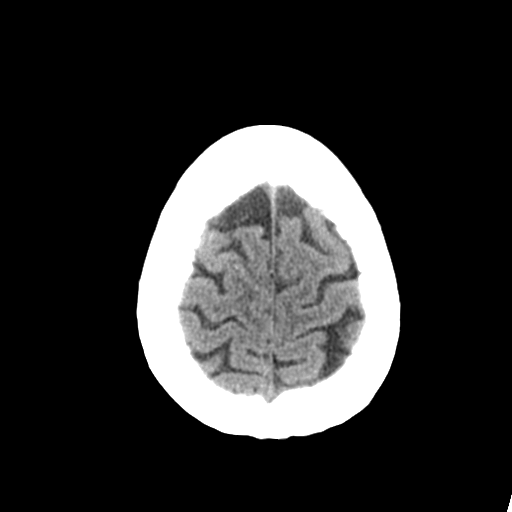
[im 28/31  brain]
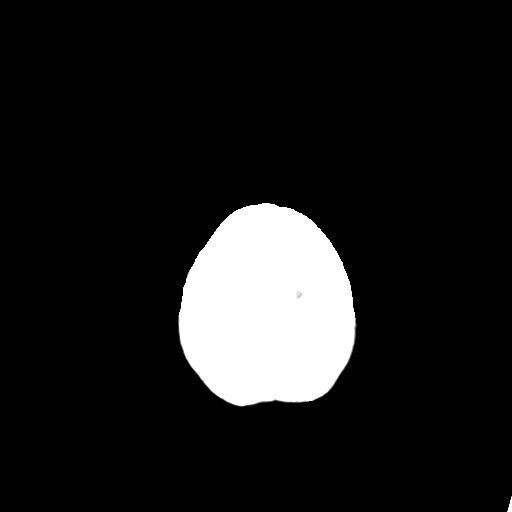
[im 28/31  bone]
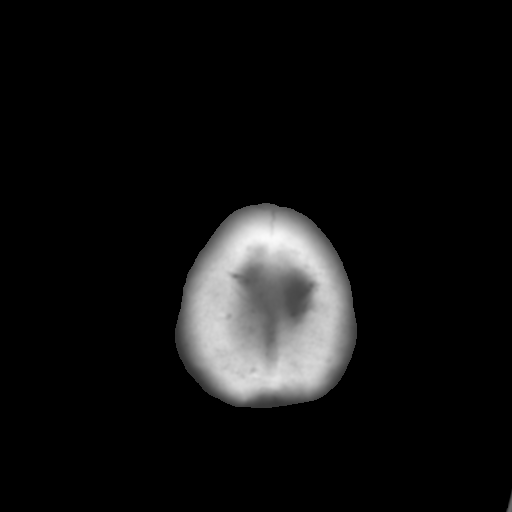

[Series 6: coronal soft tissue · coronal · 0.29mm/px · 3 of 63 slices shown]
[im 21/63  brain]
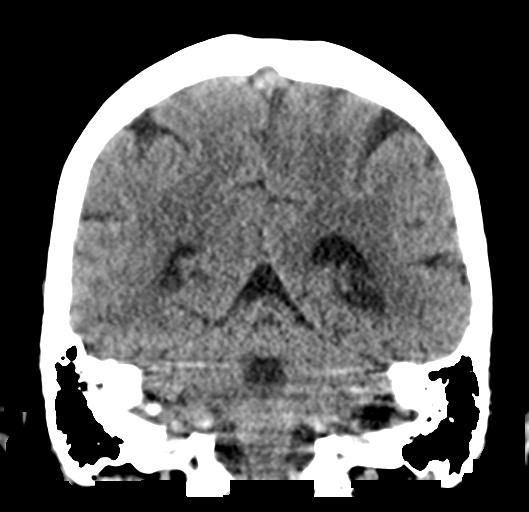
[im 28/63  brain]
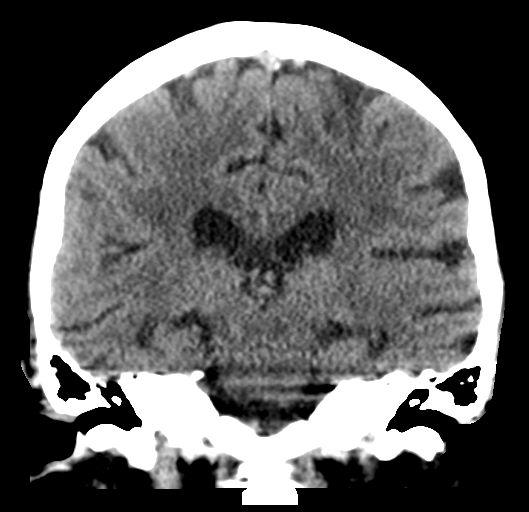
[im 35/63  brain]
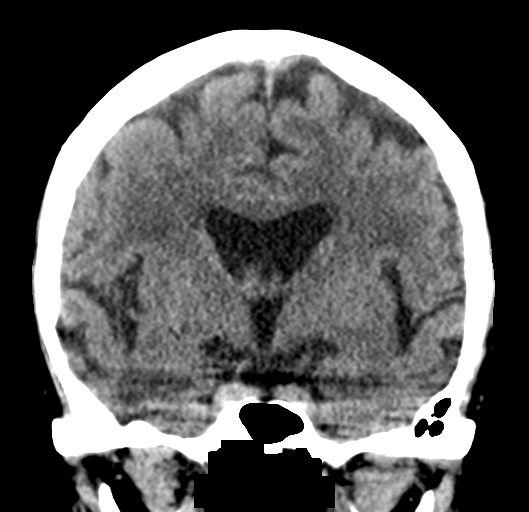

[Series 7: sagittal soft tissue · sagittal · 0.29mm/px · 3 of 52 slices shown]
[im 18/52  brain]
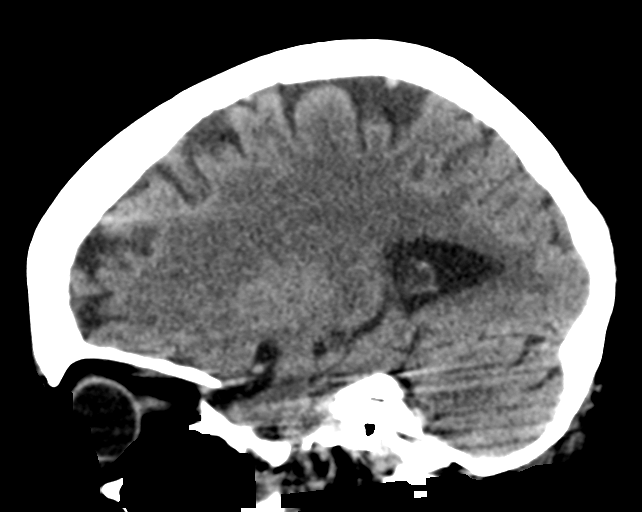
[im 26/52  brain]
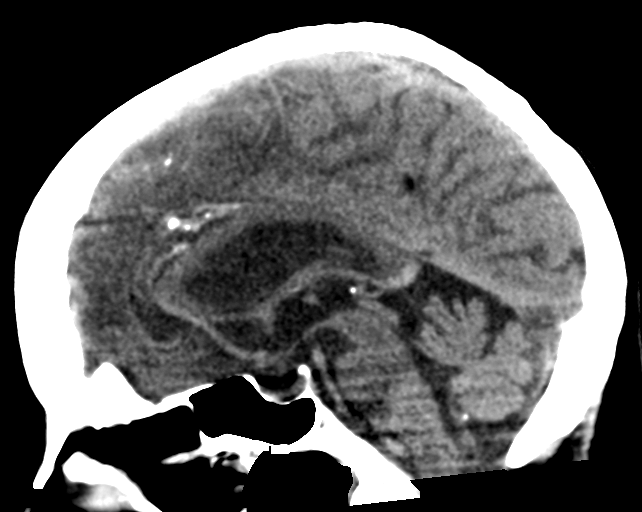
[im 35/52  brain]
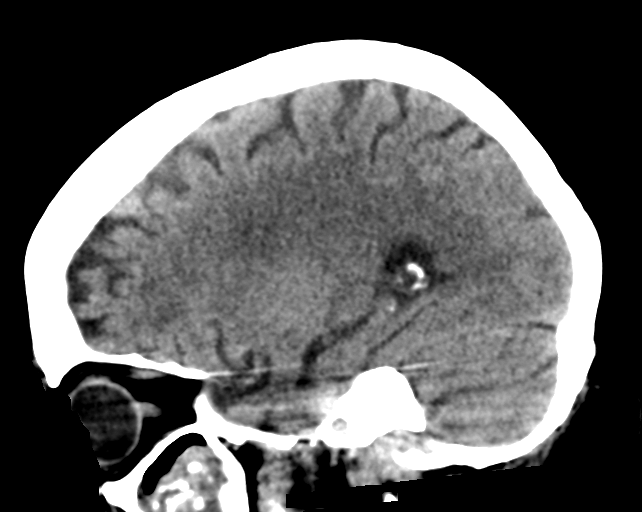

[15 of 47 positions shown; findings below may reference images not displayed]

FINDINGS: CT HEAD FINDINGS

Brain: No acute intracranial hemorrhage. No midline shift or mass
effect. Gray-white differentiation maintained. Minimal hypodensity
in the bilateral periventricular white matter. No significant volume
loss unremarkable appearance of the ventricular system.

Vascular: Intracranial atherosclerosis.

Skull: No acute fracture.  No aggressive bone lesion identified.

Sinuses/Orbits: Chronic right maxillary sinus wall thickening. Near
complete opacification the right maxillary sinus with high density
and low-density material.

Other: None

CT CERVICAL SPINE FINDINGS

Cervical Spine:

Cervical elements maintain relative anatomic alignment from the
level of C1-T1. Unremarkable appearance of the craniocervical
junction.

No subluxation, anterolisthesis, retrolisthesis.

Osteopenia.  No acute displaced fracture.

Vertebral body heights maintained.

Mild disc changes throughout the cervical spine with associated
endplate sclerosis. Uncovertebral joint disease is most pronounced
at the C4-C5 level and C6-C7 level without significant bony canal
narrowing.

Mild bilateral facet disease.

Prevertebral soft tissues within normal limits.

Unremarkable appearance of the upper lungs. Rim calcified right
thyroid nodule measures 10 mm.
IMPRESSION: Head CT:

No acute intracranial abnormality.

Mild chronic white matter disease.

Chronic right maxillary sinus disease.

Cervical CT:

No acute fracture or malalignment of the cervical spine.

Mild degenerative changes.
